# Patient Record
Sex: Male | Born: 1960 | Race: White | Hispanic: No | Smoking: Current every day smoker
Health system: Southern US, Community
[De-identification: ages and names within clinical notes are randomized; demographics above are authoritative.]

## PROBLEM LIST (undated history)

## (undated) DIAGNOSIS — I10 Essential (primary) hypertension: Secondary | ICD-10-CM

## (undated) DIAGNOSIS — J449 Chronic obstructive pulmonary disease, unspecified: Secondary | ICD-10-CM

## (undated) DIAGNOSIS — Z9861 Coronary angioplasty status: Principal | ICD-10-CM

## (undated) DIAGNOSIS — I219 Acute myocardial infarction, unspecified: Secondary | ICD-10-CM

## (undated) DIAGNOSIS — R519 Headache, unspecified: Secondary | ICD-10-CM

## (undated) DIAGNOSIS — D649 Anemia, unspecified: Secondary | ICD-10-CM

## (undated) DIAGNOSIS — K759 Inflammatory liver disease, unspecified: Secondary | ICD-10-CM

## (undated) DIAGNOSIS — I2510 Atherosclerotic heart disease of native coronary artery without angina pectoris: Principal | ICD-10-CM

## (undated) DIAGNOSIS — E785 Hyperlipidemia, unspecified: Secondary | ICD-10-CM

## (undated) HISTORY — DX: Coronary angioplasty status: Z98.61

## (undated) HISTORY — PX: TONSILLECTOMY: SUR1361

## (undated) HISTORY — DX: Atherosclerotic heart disease of native coronary artery without angina pectoris: I25.10

## (undated) HISTORY — DX: Chronic obstructive pulmonary disease, unspecified: J44.9

## (undated) HISTORY — DX: Essential (primary) hypertension: I10

## (undated) HISTORY — DX: Hyperlipidemia, unspecified: E78.5

---

## 2007-07-08 DIAGNOSIS — I2510 Atherosclerotic heart disease of native coronary artery without angina pectoris: Secondary | ICD-10-CM

## 2007-07-08 HISTORY — DX: Atherosclerotic heart disease of native coronary artery without angina pectoris: I25.10

## 2007-07-30 HISTORY — PX: CARDIAC CATHETERIZATION: SHX172

## 2007-07-30 HISTORY — PX: CORONARY STENT PLACEMENT: SHX1402

## 2008-04-06 HISTORY — PX: CARDIAC CATHETERIZATION: SHX172

## 2012-12-28 ENCOUNTER — Encounter: Payer: Self-pay | Admitting: *Deleted

## 2012-12-29 ENCOUNTER — Ambulatory Visit (INDEPENDENT_AMBULATORY_CARE_PROVIDER_SITE_OTHER): Payer: No Typology Code available for payment source | Admitting: Cardiology

## 2012-12-29 ENCOUNTER — Encounter: Payer: Self-pay | Admitting: Cardiology

## 2012-12-29 VITALS — BP 166/92 | HR 54 | Ht 72.0 in | Wt 172.0 lb

## 2012-12-29 DIAGNOSIS — Z720 Tobacco use: Secondary | ICD-10-CM

## 2012-12-29 DIAGNOSIS — R079 Chest pain, unspecified: Secondary | ICD-10-CM

## 2012-12-29 DIAGNOSIS — F17200 Nicotine dependence, unspecified, uncomplicated: Secondary | ICD-10-CM

## 2012-12-29 DIAGNOSIS — Z9861 Coronary angioplasty status: Secondary | ICD-10-CM | POA: Insufficient documentation

## 2012-12-29 DIAGNOSIS — I2510 Atherosclerotic heart disease of native coronary artery without angina pectoris: Secondary | ICD-10-CM

## 2012-12-29 DIAGNOSIS — E785 Hyperlipidemia, unspecified: Secondary | ICD-10-CM

## 2012-12-29 DIAGNOSIS — I10 Essential (primary) hypertension: Secondary | ICD-10-CM

## 2012-12-29 LAB — HEPATIC FUNCTION PANEL
Bilirubin, Direct: 0.3 mg/dL (ref 0.0–0.3)
Total Protein: 7 g/dL (ref 6.0–8.3)

## 2012-12-29 NOTE — Assessment & Plan Note (Signed)
Patient apparently with increased liver functions in the past. However he does consume significant amounts of EtOH. I will we check his LFTs today. I have asked him to decrease his alcohol use. If his liver functions are not elevated we will try Lipitor.

## 2012-12-29 NOTE — Assessment & Plan Note (Signed)
Patient counseled on discontinuing. 

## 2012-12-29 NOTE — Assessment & Plan Note (Addendum)
Continue aspirin and beta blocker. Patient is concerned about his symptoms of dizziness this past weekend. He did not have chest pain. His electrocardiogram is normal. Plan stress Myoview for risk stratification. We will obtain all of his records from [place] concerning his prior cardiac history.

## 2012-12-29 NOTE — Assessment & Plan Note (Signed)
Blood pressure is elevated. Continue beta blocker. I have asked him to track his pressure at home and we will add additional medications as needed.

## 2012-12-29 NOTE — Patient Instructions (Addendum)
Your physician has requested that you have en exercise stress myoview. For further information please visit [email or web address] Please follow instruction sheet, as given.  Your physician recommends that you return for lab work in: today  Your physician recommends that you schedule a follow-up appointment in: 3 months

## 2012-12-29 NOTE — Progress Notes (Signed)
  HPI: 52 year old male with past medical history of coronary artery disease for evaluation of dizziness and coronary disease. The patient apparently had a myocardial infarction in [place] approximately 5 years ago. He had a stent placed. I have none of those records available. 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 He describes general malaise and also dizziness particularly with smoking cigarettes. There was no dyspnea or chest pain. He otherwise denies dyspnea on exertion, orthopnea, PND, pedal edema, syncope or exertional chest pain. He was concerned about his symptoms and states they were reminiscent of those prior to his previous myocardial infarction. He did have chest pain with his infarct.  Current Outpatient Prescriptions  Medication Sig Dispense Refill  . aspirin 325 MG EC tablet Take 325 mg by mouth daily.      . metoprolol succinate (TOPROL-XL) 50 MG 24 hr tablet Take 50 mg by mouth daily. Take with or immediately following a meal.      . traZODone (DESYREL) 150 MG tablet Take 150 mg by mouth at bedtime.       No current facility-administered medications for this visit.    No Known Allergies  Past Medical History  Diagnosis Date  . Hypertension   . Hyperlipidemia   . CAD (coronary artery disease)     Prior MI; Stent [place], [place]  . COPD (chronic obstructive pulmonary disease)     Past Surgical History  Procedure Laterality Date  . Tonsillectomy      History   Social History  . Marital Status: Significant Other    Spouse Name: N/A    Number of Children: 2  . Years of Education: N/A   Occupational History  .     Social History Main Topics  . Smoking status: Current Every Day Smoker -- 0.50 packs/day for 15 years    Types: Cigarettes  . Smokeless tobacco: Never Used  . Alcohol Use: Yes     Comment: 4-5 beers per night  . Drug Use: No  . Sexually Active: Not on file   Other Topics Concern  . Not on file   Social History Narrative    . No narrative on file    Family History  Problem Relation Age of Onset  . Heart disease      No family history    ROS: no fevers or chills, productive cough, hemoptysis, dysphasia, odynophagia, melena, hematochezia, dysuria, hematuria, rash, seizure activity, orthopnea, PND, pedal edema, claudication. Remaining systems are negative.  Physical Exam:   Blood pressure 166/92, pulse 54, height 6' (1.829 m), weight 172 lb (78.019 kg).  General:  Well developed/well nourished, anxious in NAD Skin warm/dry Patient not depressed No peripheral clubbing Back-normal HEENT-normal/normal eyelids Neck supple/normal carotid upstroke bilaterally; no bruits; no JVD; no thyromegaly chest - CTA/ normal expansion CV - RRR/normal S1 and S2; no murmurs, rubs or gallops;  PMI nondisplaced Abdomen -NT/ND, no HSM, no mass, + bowel sounds, no bruit 2+ femoral pulses, no bruits Ext-no edema, chords, 2+ DP Neuro-grossly nonfocal  ECG Sinus bradycardia, no ST changes

## 2012-12-30 ENCOUNTER — Telehealth: Payer: Self-pay | Admitting: Cardiology

## 2012-12-30 NOTE — Telephone Encounter (Signed)
ROI faxed to Dr.Wilson Jeannetta Nap & Dr.Wolf/ [phone] Records received From Dr.Elkins Office 12/30/12, gave to [facility] 12/30/12/KM

## 2013-01-04 ENCOUNTER — Telehealth: Payer: Self-pay | Admitting: Cardiology

## 2013-01-04 ENCOUNTER — Encounter (HOSPITAL_COMMUNITY): Payer: No Typology Code available for payment source

## 2013-01-04 DIAGNOSIS — I2510 Atherosclerotic heart disease of native coronary artery without angina pectoris: Secondary | ICD-10-CM

## 2013-01-04 MED ORDER — ATORVASTATIN CALCIUM 10 MG PO TABS: 10.0000 mg | ORAL_TABLET | Freq: Every day | ORAL | Status: DC

## 2013-01-04 NOTE — Telephone Encounter (Signed)
New problem:  Test results.  

## 2013-01-04 NOTE — Telephone Encounter (Signed)
Spoke to patient lab results given.Advised to stop ETOH.Start Lipitor 10 mg daily.Repeat fasting lipids and hepatic panels in 6 weeks.

## 2013-01-04 NOTE — Telephone Encounter (Signed)
Pt called to let Dr. Jens Som know that when he was seen in this office on 12/30/22 pt forgot to mention to the MD that beside having dizziness;  he is also having SOB with Moderate activity on and off for  3 to 4 months. Pt was  scheduled for a stress Myoview, which he postpone because he wanted to see the labs results first. Pt was made aware that smoking will contribute to SOB; and that he needs  the stress test specially because all the  symptoms he is having. Pt verbalized understanding.

## 2013-01-04 NOTE — Telephone Encounter (Signed)
New Prob    Calling to follow up on blood work. Would like to speak nurse.

## 2013-01-05 NOTE — Telephone Encounter (Signed)
Spoke with pt, he is concerned because he has a deductible and wants to know if this will be filled with the insurance as preventive. Will get the CPT code for the testing and then the pt is going to call his insurance to see what will be covered. I explained to the pt because of his symptoms doubt it would be considered preventive.

## 2013-01-25 ENCOUNTER — Telehealth: Payer: Self-pay | Admitting: Cardiology

## 2013-01-25 NOTE — Telephone Encounter (Signed)
Records rec From [facility] Office in [place] To [place]  01/25/13/KM

## 2013-02-04 ENCOUNTER — Other Ambulatory Visit: Payer: No Typology Code available for payment source

## 2013-02-12 ENCOUNTER — Other Ambulatory Visit: Payer: No Typology Code available for payment source

## 2013-02-26 ENCOUNTER — Other Ambulatory Visit: Payer: No Typology Code available for payment source

## 2013-03-12 ENCOUNTER — Other Ambulatory Visit: Payer: No Typology Code available for payment source

## 2013-03-19 ENCOUNTER — Other Ambulatory Visit (INDEPENDENT_AMBULATORY_CARE_PROVIDER_SITE_OTHER): Payer: No Typology Code available for payment source

## 2013-03-19 DIAGNOSIS — I2510 Atherosclerotic heart disease of native coronary artery without angina pectoris: Secondary | ICD-10-CM

## 2013-03-19 LAB — LIPID PANEL
HDL: 43.7 mg/dL (ref >39.00)
Total CHOL/HDL Ratio: 2
Triglycerides: 54 mg/dL (ref 0.0–149.0)

## 2013-03-19 LAB — HEPATIC FUNCTION PANEL
ALT: 75 U/L — ABNORMAL HIGH (ref 0–53)
AST: 114 U/L — ABNORMAL HIGH (ref 0–37)
Albumin: 3.7 g/dL (ref 3.5–5.2)

## 2013-03-24 ENCOUNTER — Encounter: Payer: Self-pay | Admitting: *Deleted

## 2013-03-24 ENCOUNTER — Encounter: Payer: Self-pay | Admitting: Cardiology

## 2013-03-29 ENCOUNTER — Encounter: Payer: No Typology Code available for payment source | Admitting: Cardiology

## 2013-03-29 NOTE — Progress Notes (Signed)
   HPI: Pleasant male for fu of coronary disease. The patient apparently had a myocardial infarction in [place] approximately 5 years ago. He had a stent placed. I have none of those records available. When I saw him previously in March of 2014 he was complaining of dizziness. A nuclear study was ordered but he did not come for this. Note his LFTs have been elevated previously. Since I last saw him,    Current Outpatient Prescriptions  Medication Sig Dispense Refill  . aspirin 325 MG EC tablet Take 325 mg by mouth daily.      Marland Kitchen atorvastatin (LIPITOR) 10 MG tablet Take 1 tablet (10 mg total) by mouth daily.  30 tablet  6  . metoprolol succinate (TOPROL-XL) 50 MG 24 hr tablet Take 50 mg by mouth daily. Take with or immediately following a meal.      . traZODone (DESYREL) 150 MG tablet Take 150 mg by mouth at bedtime.       No current facility-administered medications for this visit.     Past Medical History  Diagnosis Date  . Hypertension   . Hyperlipidemia   . CAD (coronary artery disease)     Prior MI; Stent [place], [place]  . COPD (chronic obstructive pulmonary disease)     Past Surgical History  Procedure Laterality Date  . Tonsillectomy      History   Social History  . Marital Status: Significant Other    Spouse Name: N/A    Number of Children: 2  . Years of Education: N/A   Occupational History  .     Social History Main Topics  . Smoking status: Current Every Day Smoker -- 0.50 packs/day for 15 years    Types: Cigarettes  . Smokeless tobacco: Never Used  . Alcohol Use: Yes     Comment: 4-5 beers per night  . Drug Use: No  . Sexually Active: Not on file   Other Topics Concern  . Not on file   Social History Narrative  . No narrative on file    ROS: no fevers or chills, productive cough, hemoptysis, dysphasia, odynophagia, melena, hematochezia, dysuria, hematuria, rash, seizure activity, orthopnea, PND, pedal edema, claudication. Remaining systems  are negative.  Physical Exam: Well-developed well-nourished in no acute distress.  Skin is warm and dry.  HEENT is normal.  Neck is supple.  Chest is clear to auscultation with normal expansion.  Cardiovascular exam is regular rate and rhythm.  Abdominal exam nontender or distended. No masses palpated. Extremities show no edema. neuro grossly intact  ECG     This encounter was created in error - please disregard.

## 2013-04-01 ENCOUNTER — Ambulatory Visit: Payer: No Typology Code available for payment source | Admitting: Cardiology

## 2013-04-16 ENCOUNTER — Other Ambulatory Visit: Payer: Self-pay | Admitting: *Deleted

## 2013-04-16 DIAGNOSIS — N289 Disorder of kidney and ureter, unspecified: Secondary | ICD-10-CM

## 2013-04-20 ENCOUNTER — Encounter: Payer: Self-pay | Admitting: Cardiology

## 2013-05-04 ENCOUNTER — Telehealth: Payer: Self-pay | Admitting: Cardiology

## 2013-05-04 NOTE — Telephone Encounter (Signed)
Left message for pt to call.

## 2013-05-04 NOTE — Telephone Encounter (Signed)
Spoke with pt brother, he reports the pt just had labs done with dr Jeannetta Nap. Will call and see if we can get those results.

## 2013-05-04 NOTE — Telephone Encounter (Signed)
Follow Up     Following up on blood work.

## 2013-05-04 NOTE — Telephone Encounter (Signed)
Follow up  ° ° ° °Returning call back to nurse  °

## 2013-05-05 NOTE — Telephone Encounter (Signed)
Follow Up     Pts brother called returning your call. Please call back.

## 2013-05-05 NOTE — Telephone Encounter (Signed)
Spoke with pt brother, he confirms the pt is not taking lipitor. The pt has an appt with a liver specialist at [facility].

## 2013-05-28 ENCOUNTER — Other Ambulatory Visit: Payer: No Typology Code available for payment source

## 2013-07-27 DIAGNOSIS — F419 Anxiety disorder, unspecified: Secondary | ICD-10-CM | POA: Insufficient documentation

## 2013-07-27 DIAGNOSIS — F1010 Alcohol abuse, uncomplicated: Secondary | ICD-10-CM | POA: Insufficient documentation

## 2013-07-27 DIAGNOSIS — R768 Other specified abnormal immunological findings in serum: Secondary | ICD-10-CM | POA: Insufficient documentation

## 2013-07-27 DIAGNOSIS — K759 Inflammatory liver disease, unspecified: Secondary | ICD-10-CM

## 2013-07-27 HISTORY — DX: Inflammatory liver disease, unspecified: K75.9

## 2014-11-28 ENCOUNTER — Other Ambulatory Visit: Payer: Self-pay | Admitting: Orthopedic Surgery

## 2014-11-28 DIAGNOSIS — M545 Low back pain: Secondary | ICD-10-CM

## 2014-11-29 ENCOUNTER — Other Ambulatory Visit: Payer: Self-pay | Admitting: Orthopedic Surgery

## 2014-11-29 DIAGNOSIS — M545 Low back pain: Secondary | ICD-10-CM

## 2014-12-09 ENCOUNTER — Other Ambulatory Visit: Payer: No Typology Code available for payment source

## 2016-09-18 ENCOUNTER — Ambulatory Visit (INDEPENDENT_AMBULATORY_CARE_PROVIDER_SITE_OTHER): Payer: 59 | Admitting: Podiatry

## 2016-09-18 ENCOUNTER — Encounter: Payer: Self-pay | Admitting: Podiatry

## 2016-09-18 VITALS — BP 157/84 | HR 84 | Ht 72.0 in | Wt 175.0 lb

## 2016-09-18 DIAGNOSIS — L84 Corns and callosities: Secondary | ICD-10-CM | POA: Diagnosis not present

## 2016-09-18 DIAGNOSIS — M79671 Pain in right foot: Secondary | ICD-10-CM | POA: Diagnosis not present

## 2016-09-18 DIAGNOSIS — L851 Acquired keratosis [keratoderma] palmaris et plantaris: Secondary | ICD-10-CM | POA: Diagnosis not present

## 2016-09-18 DIAGNOSIS — M79672 Pain in left foot: Secondary | ICD-10-CM | POA: Diagnosis not present

## 2016-09-18 MED ORDER — HYDROCODONE-ACETAMINOPHEN 5-325 MG PO TABS: 1.0000 | ORAL_TABLET | Freq: Four times a day (QID) | ORAL | 0 refills | Status: DC | PRN

## 2016-09-18 NOTE — Progress Notes (Signed)
Subjective: Patient presents to the office today for chief complaint of painful callus lesions of the feet. Patient states that the pain is ongoing and is affecting their ability to ambulate without pain. Patient presents today for further treatment and evaluation.  Objective:  Physical Exam General: Alert and oriented x3 in no acute distress  Dermatology: Hyperkeratotic lesion present on the weightbearing surfaces of the bilateral great toes and fifth MPJs. Pain on palpation with a central nucleated core noted.  Skin is warm, dry and supple bilateral lower extremities. Negative for open lesions or macerations.  Vascular: Palpable pedal pulses bilaterally. No edema or erythema noted. Capillary refill within normal limits.  Neurological: Epicritic and protective threshold grossly intact bilaterally.   Musculoskeletal Exam: Pain on palpation at the keratotic lesion noted. Range of motion within normal limits bilateral. Muscle strength 5/5 in all groups bilateral.  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  #3 Treated area(s) with Salinocaine and dressed with light dressing. #4 prescription [facility] keratinolytic [facility] #5 prescription for hydrocodone 5/325mg  #6 Patient is to return to the clinic PRN.   Felecia ShellingBrent M. Evans, DPM [facility]

## 2016-09-20 MED ORDER — NONFORMULARY OR COMPOUNDED ITEM: 1.0000 g | Freq: Every day | 2 refills | Status: DC

## 2016-09-20 NOTE — Addendum Note (Signed)
Addended by: Renaldo ReelPARRY, MELODY A on: 09/20/2016 05:00 PM   Modules accepted: Orders

## 2017-07-14 ENCOUNTER — Telehealth: Payer: Self-pay | Admitting: Cardiology

## 2017-07-14 NOTE — Telephone Encounter (Signed)
Received records from [facility] for appointment on 08/05/17 with Dr Jens Som.  Records put with Dr Ludwig Clarks schedule for 08/05/17. lp

## 2017-08-05 ENCOUNTER — Ambulatory Visit: Payer: No Typology Code available for payment source | Admitting: Cardiology

## 2017-11-05 ENCOUNTER — Ambulatory Visit (INDEPENDENT_AMBULATORY_CARE_PROVIDER_SITE_OTHER): Payer: BLUE CROSS/BLUE SHIELD | Admitting: Podiatry

## 2017-11-05 DIAGNOSIS — M7752 Other enthesopathy of left foot: Secondary | ICD-10-CM | POA: Diagnosis not present

## 2017-11-05 DIAGNOSIS — L989 Disorder of the skin and subcutaneous tissue, unspecified: Secondary | ICD-10-CM | POA: Diagnosis not present

## 2017-11-08 NOTE — Progress Notes (Signed)
   Subjective: Patient presents to the office today for a chief complaint of painful callus lesions of the bilateral feet that has been ongoing for several years. He also reports pain to the lateral left foot. He states he stands on concrete all day which exacerbates the pain. He has tried soaking his feet and wearing different shoes with no significant relief. Patient presents today for further treatment and evaluation.   Past Medical History:  Diagnosis Date  . CAD (coronary artery disease)    Prior MI; [place]  . COPD (chronic obstructive pulmonary disease) (HCC)   . Hyperlipidemia   . Hypertension      Objective:  Physical Exam General: Alert and oriented x3 in no acute distress  Dermatology: Hyperkeratotic lesions present on the left sub-fifth MPJ and bilateral great toes. Pain on palpation with a central nucleated core noted. Skin is warm, dry and supple bilateral lower extremities. Negative for open lesions or macerations.  Vascular: Palpable pedal pulses bilaterally. No edema or erythema noted. Capillary refill within normal limits.  Neurological: Epicritic and protective threshold grossly intact bilaterally.   Musculoskeletal Exam: Pain on palpation at the keratotic lesions noted as well as the 5th MPJ of the left foot. Range of motion within normal limits bilateral. Muscle strength 5/5 in all groups bilateral.  Assessment: #1 Porokeratosis left sub-fifth MPJ #2 bilateral great toes calluses  #3 5th MPJ capsulitis left   Plan of Care:  #1 Patient evaluated. #2 Excisional debridement of keratoic lesions using a chisel blade was performed without incident.  #3 Dressed area with light dressing. #4 Injection of 0.5 mLs Celestone Soluspan injected into the 5th MPJ of the left foot. #5 Recommended OTC salicylic acid cream. #6 Patient is to return to the clinic PRN.   Felecia ShellingBrent M. Evans, DPM [facility]  Dr. Felecia ShellingBrent M. Evans, DPM    [address]                                        [place], [place] [id number]                Office [phone]  Fax [phone]

## 2017-11-24 ENCOUNTER — Encounter: Payer: Self-pay | Admitting: Cardiology

## 2017-11-24 ENCOUNTER — Ambulatory Visit: Payer: BLUE CROSS/BLUE SHIELD | Admitting: Cardiology

## 2017-11-24 VITALS — BP 163/88 | HR 80 | Ht 71.0 in | Wt 169.0 lb

## 2017-11-24 DIAGNOSIS — I2510 Atherosclerotic heart disease of native coronary artery without angina pectoris: Secondary | ICD-10-CM | POA: Diagnosis not present

## 2017-11-24 DIAGNOSIS — E782 Mixed hyperlipidemia: Secondary | ICD-10-CM | POA: Diagnosis not present

## 2017-11-24 DIAGNOSIS — Z01810 Encounter for preprocedural cardiovascular examination: Secondary | ICD-10-CM

## 2017-11-24 DIAGNOSIS — Z720 Tobacco use: Secondary | ICD-10-CM

## 2017-11-24 DIAGNOSIS — Z9861 Coronary angioplasty status: Secondary | ICD-10-CM

## 2017-11-24 DIAGNOSIS — I10 Essential (primary) hypertension: Secondary | ICD-10-CM | POA: Diagnosis not present

## 2017-11-24 NOTE — Patient Instructions (Signed)
No medication changes     TEST SCHEDULE AT [address] Your physician has requested that you have an echocardiogram. Echocardiography is a painless test that uses sound waves to create images of your heart. It provides your doctor with information about the size and shape of your heart and how well your heart's chambers and valves are working. This procedure takes approximately one hour. There are no restrictions for this procedure.  IF TEST IS NORMAL MAY HAVE CLEARANCE FOR SURGERY WITH DR EUGENE BELL.    Your physician recommends that you schedule a follow-up appointment in 1 MONTH WITH DR HARDING.

## 2017-11-24 NOTE — Assessment & Plan Note (Signed)
Earl Campbell has a pending urologic surgery which is would be a low risk from a cardiac standpoint.  He has no active anginal or heart failure symptoms.  No history of stroke, diabetes or renal insufficiency.  As such, by the revised cardiovascular risk index, he would be a low risk patient for a low risk surgery. By [facility] AHA guidelines, in the absence of any active anginal or heart failure symptoms, there is no indication for nuclear stress test or other ischemic evaluation. I do think since he has been lost to follow-up for several years, an echocardiogram was a reasonable choice to start with.  If there is no regional wall motion normalities or reduced EF, I do not see any reason for ischemic evaluation.  Recommendation will be to proceed with surgery if the echocardiogram is relatively normal (i.e. no regional wall motion abnormality).

## 2017-11-24 NOTE — Assessment & Plan Note (Signed)
He did not seem interested in cessation counseling.

## 2017-11-24 NOTE — Assessment & Plan Note (Signed)
Thankfully no further symptoms.  No chest pain or dyspnea with rest or exertion. He had been on a beta-blocker, but not currently now.  He thinks it may been stopped by 1 of his doctors along the way. For now would simply continue his current medications as making any adjustments preoperatively would probably not help anyway. Also no longer on a statin, likely related to liver disease.   He never had a heart attack by the catheter reports, and on both those reports his ejection fraction was somewhere between 55-70%.  There were no regional wall motion normalities. I think for baseline evaluation, a 2D echocardiogram will help give us a sense of the EF as well as filling pressures.  There will also be opportunity to identify any potential regional wall motion more extensive ischemic evaluation. Otherwise with no active anginal symptoms, I see no reason to perform a Myoview stress test, even if it is for preop.

## 2017-11-24 NOTE — Assessment & Plan Note (Addendum)
There is definitely concern for LFTs with his questionable hep C history as well as chronic alcohol use.  Probably not the best option to use a statin.  I am not sure if his PCP has been following these levels.  No labs available.

## 2017-11-24 NOTE — Assessment & Plan Note (Signed)
Blood pressure is high today.  I have no idea what his baseline has been with his PCP, but he can probably tolerate taking his full dose of antihypertensive agent.  Would also definitely benefit from beta-blocker, but he indicates that this was stopped by 1 of his physicians, so I will not that battle at this time.

## 2017-11-24 NOTE — Progress Notes (Signed)
PCP: Earl MaskElkins, Earl Oliver, MD Urology - Latrelle DodrillEugene D.Alvester MorinBell, MD  Clinic Note: Chief Complaint  Patient presents with  . New Patient (Initial Visit)    Preop  . Coronary Artery Disease    History of PCI in 2008 has not seen a cardiologist since 2009-     HPI: Earl Campbell is a 57 y.o. male who is being seen today for preoperative risk evaluation of history of CAD-PCI at the request of Earl Campbell, Earl Campbell, *as well as Dr. Modena SlaterEugene Bell. Earl Campbell is in the process of being evaluated for what sounds like a prostate surgery, however from the PCPs noted suggests hernia surgery.  Earl Campbell is a very pleasant gentleman long-term smoker of at least 1/2 to 1 PPD for over 30 years who also drinks anywhere from a 6 pack to 12 pack of beer a night.  He has a history of single-vessel CAD from October 2008 while he was living in [place].  He apparently was admitted with chest pain and had a Cardiolite showing likely anterior ischemia (the actual Cardiolite report is not present).  He underwent cardiac catheterization showing 80% mid LAD lesion treated with DES stent (see past surgical history). He was seen by Dr. Jens Somrenshaw back in 2014, at that time he was on a statin and beta-blocker which she is no longer on either.  He only takes one half of his blood pressure pills a day.  Earl Campbell Lynn Campbell was last seen by his PCP in September.  Basically this has that there is a question of if [facility] is [facility] up, he may need to have cardiology evaluation prior to surgery.  Recent Hospitalizations: None  Studies Personally Reviewed - (if available, images/films reviewed: From Epic Chart or Care Everywhere)  Cardiac catheterization and PCI from October 2008 as well as July 2009 -reports reviewed and PSH updated.  Interval History:  Earl Campbell presents here today pretty much not sure why he is here.  When I asked him initially about his history, there was no comment about prior PCI.  This was gleaned by chart review of  procedures and a quick note from his PCP.  He does not remember if he had a heart attack at the time of her was simply related to chest pain.  However reviewing his catheter reports it clearly was not an MI.  He presented with anginal symptoms, had an abnormal Myoview/Cardiolite that led to a cardiac catheterization.  He actually does not even recall the symptoms he was feeling at that time.  Since that time he did go back into the hospital once in 2009 for chest discomfort that was thought to be not anginal as his cardiac cath showed relatively normal coronary arteries with widely patent stent and only 30% RCA.  Since then, he has seen cardiologist once since moving to [place].  He saw Dr. Jens Somrenshaw in March 2014 had a stress test ordered at that time which he did not follow through.  Apparently he was no longer taking Lipitor due to concerns for liver disease.  (Apparently had hepatitis C, and likely also has some complement  He has mild baseline dyspnea, but otherwise negative cardiac review of symptoms:  No chest pain or  shortness of breath with rest or exertion.   No PND, orthopnea or edema.   No palpitations, lightheadedness, dizziness, weakness or syncope/near syncope.  No TIA/amaurosis fugax symptoms.  No melena, hematochezia, hematuria, or epstaxis.  No claudication.  ROS: A comprehensive was performed. Review of  Systems  Constitutional: Negative for malaise/fatigue.  HENT: Negative for nosebleeds.   Respiratory: Positive for cough (Morning smoker's cough.) and shortness of breath (No change from baseline). Negative for sputum production and wheezing.   Gastrointestinal: Negative for abdominal pain, blood in stool, constipation, heartburn and melena.  Musculoskeletal: Negative for falls and joint pain.  Neurological: Negative for dizziness and weakness.  Psychiatric/Behavioral: Negative for memory loss (Although he has very poor recollection of just about anything about his  medical health.).  All other systems reviewed and are negative.     I have reviewed and (if needed) personally updated the patient's problem list, medications, allergies, past medical and surgical history, social and family history.   Past Medical History:  Diagnosis Date  . CAD S/P percutaneous coronary angioplasty 07/2007    abnormal nuclear ST --> Cath --> 80%mLAD - PCI with Promus DES 3.0 mm x 23 mm (~3.2 mm).[place], [place].  - Patent stent with ~30% RCA on relook cath 04/2008  . COPD (chronic obstructive pulmonary disease) (HCC)   . Hyperlipidemia   . Hypertension     Past Surgical History:  Procedure Laterality Date  . CARDIAC CATHETERIZATION  07/30/2007   (Dr. Marquette Saa --[facility] [place], [place]): abnormal Nuclear ST ordered to evaluate chest pain- 80% mLAD just after the major diagonal--> PCI; small ramus intermedius noted.  That the circumflex are relatively normal.  Dominant RCA relatively normal.  EF 70%.  . CARDIAC CATHETERIZATION  04/2008   [facility], [place]; Patent mLAD stent, 30%.  EF at least 55%.  . CORONARY STENT PLACEMENT  07/30/2007   PCI - mLAD - Promus DES 3.0 mm x 23 mm (3.2 mm).  . TONSILLECTOMY      Current Meds  Medication Sig  . aspirin 325 MG EC tablet Take 325 mg by mouth daily.  Marland Kitchen lisinopril-hydrochlorothiazide (PRINZIDE,ZESTORETIC) 20-25 MG tablet TAKE 1/2 TABLET BY MOUTH EVERY 24 HOIURS FOR HYPERTENSION  . tamsulosin (FLOMAX) 0.4 MG CAPS capsule   . [DISCONTINUED] amLODipine (NORVASC) 10 MG tablet Take 10 mg by mouth daily.  . [DISCONTINUED] HYDROcodone-acetaminophen (NORCO) 5-325 MG tablet Take 1 tablet by mouth every 6 (six) hours as needed for moderate pain.  . [DISCONTINUED] metoprolol succinate (TOPROL-XL) 50 MG 24 hr tablet Take 50 mg by mouth daily. Take with or immediately following a meal.  . [DISCONTINUED] NONFORMULARY OR COMPOUNDED ITEM Apply 1-2 g topically daily.  .  [DISCONTINUED] QUEtiapine (SEROQUEL) 25 MG tablet TAKE 1 TABLET BY MOUTH AT BEDTIME X1 WEEK, 2 TABS X1 WEEK, 3 TABS X 1 WEEK THEN 4 TABS X 1 WEEK  . [DISCONTINUED] Tamsulosin HCl (FLOMAX PO) Take by mouth.  . [DISCONTINUED] traZODone (DESYREL) 150 MG tablet Take 150 mg by mouth at bedtime.    No Known Allergies  Social History   Tobacco Use  . Smoking status: Current Every Day Smoker    Packs/day: 0.50    Years: 30.00    Pack years: 15.00    Types: Cigarettes  . Smokeless tobacco: Never Used  . Tobacco comment: Has cut down from 1 pack/day  Substance Use Topics  . Alcohol use: Yes    Comment: 6-12 beers per night  . Drug use: No   Social History   Social History Narrative   Dardan now lives here in [place] with his brother Marston.  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  Currently working for [employer].   He  indicates that he drinks anywhere from 6-12 beers a night.   He is separated from his wife who now lives in [place].    family history includes Heart disease in his brother and unknown relative; Hypertension in his father and mother.  Wt Readings from Last 3 Encounters:  11/24/17 169 lb (76.7 kg)  09/18/16 175 lb (79.4 kg)  12/29/12 172 lb (78 kg)    PHYSICAL EXAM BP (!) 163/88   Pulse 80   Ht 5\' 11"  (1.803 m)   Wt 169 lb (76.7 kg)   BMI 23.57 kg/m  Physical Exam  Constitutional: He is oriented to person, place, and time. He appears well-developed and well-nourished. No distress.  Well-groomed.  Smells of tobacco smoke.  HENT:  Head: Normocephalic and atraumatic.  Mouth/Throat: Oropharynx is clear and moist.  Very poor dentition  Eyes: Conjunctivae and EOM are normal. Pupils are equal, round, and reactive to light. No scleral icterus.  Neck: Normal range of motion. Neck supple. No hepatojugular reflux and no JVD present. Carotid bruit is not present.  Cardiovascular: Normal rate, regular rhythm, normal heart sounds and  normal pulses.  No extrasystoles are present. PMI is not displaced. Exam reveals no gallop and no friction rub.  No murmur heard. Pulmonary/Chest: Effort normal. No respiratory distress. He has no wheezes. He has no rales.  Mild diffuse interstitial sounds with intermittent rhonchi.  Abdominal: Soft. Bowel sounds are normal. He exhibits no distension. There is no tenderness.  Musculoskeletal: Normal range of motion. He exhibits no edema.  Neurological: He is alert and oriented to person, place, and time. No cranial nerve deficit.  Skin: Skin is warm and dry.  Psychiatric: He has a normal mood and affect. His behavior is normal. Thought content normal.  Somewhat slow, blunted affect.  Does not really fully comprehend his cardiac history.  Nursing note and vitals reviewed.    Adult ECG Report  Rate: 80;  Rhythm: normal sinus rhythm and Normal axis, intervals and durations;   Narrative Interpretation: Normal EKG   Other studies Reviewed: Additional studies/ records that were reviewed today include:  Recent Labs:   No results found for: CREATININE, BUN, NA, K, CL, CO2 No labs available   ASSESSMENT / PLAN: Problem List Items Addressed This Visit    CAD S/P percutaneous coronary angioplasty - Primary (Chronic)    Thankfully no further symptoms.  No chest pain or dyspnea with rest or exertion. He had been on a beta-blocker, but not currently now.  He thinks it may been stopped by 1 of his doctors along the way. For now would simply continue his current medications as making any adjustments preoperatively would probably not help anyway. Also no longer on a statin, likely related to liver disease.   He never had a heart attack by the catheter reports, and on both those reports his ejection fraction was somewhere between 55-70%.  There were no regional wall motion normalities. I think for baseline evaluation, a 2D echocardiogram will help give [place] a sense of the EF as well as filling  pressures.  There will also be opportunity to identify any potential regional wall motion more extensive ischemic evaluation. Otherwise with no active anginal symptoms, I see no reason to perform a Myoview stress test, even if it is for preop.      Relevant Medications   lisinopril-hydrochlorothiazide (PRINZIDE,ZESTORETIC) 20-25 MG tablet   Other Relevant Orders   EKG 12-Lead   ECHOCARDIOGRAM COMPLETE   Essential hypertension (Chronic)  Blood pressure is high today.  I have no idea what his baseline has been with his PCP, but he can probably tolerate taking his full dose of antihypertensive agent.  Would also definitely benefit from beta-blocker, but he indicates that this was stopped by 1 of his physicians, so I will not that battle at this time.      Relevant Medications   lisinopril-hydrochlorothiazide (PRINZIDE,ZESTORETIC) 20-25 MG tablet   Hyperlipidemia (Chronic)    There is definitely concern for LFTs with his questionable hep C history as well as chronic alcohol use.  Probably not the best option to use a statin.  I am not sure if his PCP has been following these levels.  No labs available.      Relevant Medications   lisinopril-hydrochlorothiazide (PRINZIDE,ZESTORETIC) 20-25 MG tablet   Preop cardiovascular exam    Takai has a pending urologic surgery which is would be a low risk from a cardiac standpoint.  He has no active anginal or heart failure symptoms.  No history of stroke, diabetes or renal insufficiency.  As such, by the revised cardiovascular risk index, he would be a low risk patient for a low risk surgery. By [facility] AHA guidelines, in the absence of any active anginal or heart failure symptoms, there is no indication for nuclear stress test or other ischemic evaluation. I do think since he has been lost to follow-up for several years, an echocardiogram was a reasonable choice to start with.  If there is no regional wall motion normalities or reduced EF, I do not see  any reason for ischemic evaluation.  Recommendation will be to proceed with surgery if the echocardiogram is relatively normal (i.e. no regional wall motion abnormality).      Relevant Orders   EKG 12-Lead   ECHOCARDIOGRAM COMPLETE   Tobacco abuse (Chronic)    He did not seem interested in cessation counseling.         Current medicines are reviewed at length with the patient today. (+/- concerns) none The following changes have been made:None  Patient Instructions  No medication changes     TEST SCHEDULE AT [address] Your physician has requested that you have an echocardiogram. Echocardiography is a painless test that uses sound waves to create images of your heart. It provides your doctor with information about the size and shape of your heart and how well your heart's chambers and valves are working. This procedure takes approximately one hour. There are no restrictions for this procedure.  IF TEST IS NORMAL MAY HAVE CLEARANCE FOR SURGERY WITH DR Earl BELL.    Your physician recommends that you schedule a follow-up appointment in 1 MONTH WITH DR .     Studies Ordered:   Orders Placed This Encounter  Procedures  . EKG 12-Lead  . ECHOCARDIOGRAM COMPLETE      Bryan Lemma, M.D., M.S. Interventional Cardiologist   Pager # [phone] Phone # [phone] [address] [place], [place] [address]   Thank you for choosing [facility] at [facility]!!

## 2017-11-27 ENCOUNTER — Telehealth: Payer: Self-pay | Admitting: Cardiology

## 2017-11-27 NOTE — Telephone Encounter (Signed)
Returned the call to the patient. He called in wanting to know why he had to have an echo. It was explained that this was preoperative and for a baseline to evaluate his EF and overall heart structure.   He stated that this will cost him $2200 so would rather not do it at this time. He does not know how much his surgery will cost and he cannot afford to do this if it is not absolutely necessary.

## 2017-11-27 NOTE — Telephone Encounter (Signed)
New Message     Patient would like to speak with Dr Herbie Baltimoreharding on why he needs to have a Echo is it medically necessary

## 2017-11-27 NOTE — Telephone Encounter (Signed)
patient cancelled echo for 11/28/2017 pt doesnt want test it is not medically necessary per pt

## 2017-11-28 ENCOUNTER — Other Ambulatory Visit (HOSPITAL_COMMUNITY): Payer: No Typology Code available for payment source

## 2017-11-28 NOTE — Telephone Encounter (Signed)
The reason for doing the echo is not had any evaluation of his heart in over 10 years.  To have his surgery, we need to have some type of assessment of his heart function to know his overall cardiac risk.  He does not have to have it done if he does not want to have it done, but cannot then fully comment on his cardiovascular risk.   Is he saying that the full $2000 cost falls on him with no coverage from the insurance company?  If so, then it seems like an issue with coverage that we may need to figure out.  Bryan Lemmaavid Harding, MD

## 2017-11-28 NOTE — Telephone Encounter (Signed)
Patient stated that he would do the echo if we could possibly find somewhere cheaper to get it done.

## 2017-12-02 NOTE — Telephone Encounter (Signed)
Follow up   Patient following up on call from nurse.

## 2017-12-05 NOTE — Telephone Encounter (Signed)
Spoke with patient . Patient states he will have echo done next week at the [address] location. RN offer to switch location . Patient would prefer the TO CONTINUE WITH SCHEDULE AS PLANNED. DUE TO MISSING WORK.

## 2017-12-10 ENCOUNTER — Ambulatory Visit (HOSPITAL_COMMUNITY): Payer: BLUE CROSS/BLUE SHIELD | Attending: Cardiology

## 2017-12-10 ENCOUNTER — Other Ambulatory Visit: Payer: Self-pay

## 2017-12-10 DIAGNOSIS — I2510 Atherosclerotic heart disease of native coronary artery without angina pectoris: Secondary | ICD-10-CM | POA: Diagnosis not present

## 2017-12-10 DIAGNOSIS — Z9861 Coronary angioplasty status: Secondary | ICD-10-CM | POA: Diagnosis present

## 2017-12-10 DIAGNOSIS — I420 Dilated cardiomyopathy: Secondary | ICD-10-CM | POA: Insufficient documentation

## 2017-12-10 DIAGNOSIS — Z01810 Encounter for preprocedural cardiovascular examination: Secondary | ICD-10-CM | POA: Insufficient documentation

## 2017-12-17 ENCOUNTER — Telehealth: Payer: Self-pay | Admitting: Cardiology

## 2017-12-17 NOTE — Telephone Encounter (Signed)
New message   Pre op status Pam from Dr Shannan HarperBell's office [phone] ext [phone], calling to follow up on echo that was done as part of the pre op clearance.   Fax [phone] Attn: Elita QuickPam

## 2017-12-18 NOTE — Telephone Encounter (Signed)
   Primary Cardiologist: Dr. Herbie BaltimoreHarding  Chart reviewed as part of pre-operative protocol coverage. Given past medical history and time since last visit, based on ACC/AHA guidelines, Earl Campbell would be at acceptable risk for the planned procedure without further cardiovascular testing.   Pt was seen by Dr. Herbie BaltimoreHarding on 11/24/17 and he cleared him for surgery. Felt to be low risk . Echo was obtained and showed normal LVEF and mild aortic valve calcification. No further w/u needed. He can be cleared for surgery.   I will route this recommendation to the requesting party via Epic fax function and remove from pre-op pool.  Please call with questions.  Robbie LisBrittainy Simmons, PA-C 12/18/2017, 4:02 PM

## 2017-12-30 NOTE — Progress Notes (Signed)
Chart reviewed as part of pre-operative protocol coverage. Given past medical history and time since last visit, based on ACC/AHA guidelines, Earl Campbell would be at acceptable risk for the planned procedure without further cardiovascular testing.   Pt was seen by Dr. Herbie BaltimoreHarding on 11/24/17 and he cleared him for surgery. Felt to be low risk . Echo was obtained and showed normal LVEF and mild aortic valve calcification. No further w/u needed. He can be cleared for surgery.   I will route this recommendation to the requesting party via Epic fax function and remove from pre-op pool.  Please call with questions.  Robbie LisBrittainy Simmons, PA-C 12/18/2017, 4:02 PM

## 2018-01-05 ENCOUNTER — Ambulatory Visit: Payer: No Typology Code available for payment source | Admitting: Cardiology

## 2018-09-15 ENCOUNTER — Ambulatory Visit (INDEPENDENT_AMBULATORY_CARE_PROVIDER_SITE_OTHER): Payer: BLUE CROSS/BLUE SHIELD | Admitting: Podiatry

## 2018-09-15 DIAGNOSIS — L84 Corns and callosities: Secondary | ICD-10-CM

## 2018-09-15 DIAGNOSIS — M79675 Pain in left toe(s): Secondary | ICD-10-CM

## 2018-09-15 DIAGNOSIS — B351 Tinea unguium: Secondary | ICD-10-CM | POA: Diagnosis not present

## 2018-09-15 DIAGNOSIS — Q828 Other specified congenital malformations of skin: Secondary | ICD-10-CM | POA: Diagnosis not present

## 2018-09-15 DIAGNOSIS — M79674 Pain in right toe(s): Secondary | ICD-10-CM

## 2018-09-15 NOTE — Patient Instructions (Addendum)
Corns and Calluses Corns are small areas of thickened skin that occur on the top, sides, or tip of a toe. They contain a cone-shaped core with a point that can press on a nerve below. This causes pain. Calluses are areas of thickened skin that can occur anywhere on the body including hands, fingers, palms, soles of the feet, and heels.Calluses are usually larger than corns. What are the causes? Corns and calluses are caused by rubbing (friction) or pressure, such as from shoes that are too tight or do not fit properly. What increases the risk? Corns are more likely to develop in people who have toe deformities, such as hammer toes. Since calluses can occur with friction to any area of the skin, calluses are more likely to develop in people who:  Work with their hands.  Wear shoes that fit poorly, shoes that are too tight, or shoes that are high-heeled.  Have toes deformities.  What are the signs or symptoms? Symptoms of a corn or callus include:  A hard growth on the skin.  Pain or tenderness under the skin.  Redness and swelling.  Increased discomfort while wearing tight-fitting shoes.  How is this diagnosed? Corns and calluses may be diagnosed with a medical history and physical exam. How is this treated? Corns and calluses may be treated with:  Removing the cause of the friction or pressure. This may include: ? Changing your shoes. ? Wearing shoe inserts (orthotics) or other protective layers in your shoes, such as a corn pad. ? Wearing gloves.  Medicines to help soften skin in the hardened, thickened areas.  Reducing the size of the corn or callus by removing the dead layers of skin.  Antibiotic medicines to treat infection.  Surgery, if a toe deformity is the cause.  Follow these instructions at home:  Take medicines only as directed by your health care provider.  If you were prescribed an antibiotic, finish all of it even if you start to feel better.  Wear  shoes that fit well. Avoid wearing high-heeled shoes and shoes that are too tight or too loose.  Wear any padding, protective layers, gloves, or orthotics as directed by your health care provider.  Soak your hands or feet and then use a file or pumice stone to soften your corn or callus. Do this as directed by your health care provider.  Check your corn or callus every day for signs of infection. Watch for: ? Redness, swelling, or pain. ? Fluid, blood, or pus. Contact a health care provider if:  Your symptoms do not improve with treatment.  You have increased redness, swelling, or pain at the site of your corn or callus.  You have fluid, blood, or pus coming from your corn or callus.  You have new symptoms. This information is not intended to replace advice given to you by your health care provider. Make sure you discuss any questions you have with your health care provider. Document Released: 06/29/2004 Document Revised: 04/12/2016 Document Reviewed: 09/19/2014 Elsevier Interactive Patient Education  2018 [employer]. Onychomycosis/Fungal Toenails  WHAT IS IT? An infection that lies within the keratin of your nail plate that is caused by a fungus.  WHY ME? Fungal infections affect all ages, sexes, races, and creeds.  There may be many factors that predispose you to a fungal infection such as age, coexisting medical conditions such as diabetes, or an autoimmune disease; stress, medications, fatigue, genetics, etc.  Bottom line: fungus thrives in a warm, moist environment   and your shoes offer such a location.  IS IT CONTAGIOUS? Theoretically, yes.  You do not want to share shoes, nail clippers or files with someone who has fungal toenails.  Walking around barefoot in the same room or sleeping in the same bed is unlikely to transfer the organism.  It is important to realize, however, that fungus can spread easily from one nail to the next on the same foot.  HOW DO WE TREAT THIS?  There  are several ways to treat this condition.  Treatment may depend on many factors such as age, medications, pregnancy, liver and kidney conditions, etc.  It is best to ask your doctor which options are available to you.  1. No treatment.   Unlike many other medical concerns, you can live with this condition.  However for many people this can be a painful condition and may lead to ingrown toenails or a bacterial infection.  It is recommended that you keep the nails cut short to help reduce the amount of fungal nail. 2. Topical treatment.  These range from herbal remedies to prescription strength nail lacquers.  About 40-50% effective, topicals require twice daily application for approximately 9 to 12 months or until an entirely new nail has grown out.  The most effective topicals are medical grade medications available through physicians offices. 3. Oral antifungal medications.  With an 80-90% cure rate, the most common oral medication requires 3 to 4 months of therapy and stays in your system for a year as the new nail grows out.  Oral antifungal medications do require blood work to make sure it is a safe drug for you.  A liver function panel will be performed prior to starting the medication and after the first month of treatment.  It is important to have the blood work performed to avoid any harmful side effects.  In general, this medication safe but blood work is required. 4. Laser Therapy.  This treatment is performed by applying a specialized laser to the affected nail plate.  This therapy is noninvasive, fast, and non-painful.  It is not covered by insurance and is therefore, out of pocket.  The results have been very good with a 80-95% cure rate.  [facility] is the only practice in the area to offer this therapy. 5. Permanent Nail Avulsion.  Removing the entire nail so that a new nail will not grow back.  

## 2018-10-17 ENCOUNTER — Encounter: Payer: Self-pay | Admitting: Podiatry

## 2018-10-17 NOTE — Progress Notes (Signed)
Subjective: Earl Campbell presents today with painful porokeratotic lesion left foot and  thick toenails 1-5 b/l that he cannot cut and which interfere with daily activities. Pain is aggravated when weightbearing with and without shoe gear. Pain resolves with periodic professional debridement.    [name], MD    Current Outpatient Medications:  .  aspirin 325 MG EC tablet, Take 325 mg by mouth daily., Disp: , Rfl:  .  clonazePAM (KLONOPIN) 0.5 MG tablet, , Disp: , Rfl:  .  lisinopril-hydrochlorothiazide (PRINZIDE,ZESTORETIC) 20-25 MG tablet, TAKE 1/2 TABLET BY MOUTH EVERY 24 HOIURS FOR HYPERTENSION, Disp: , Rfl: 4 .  MILK THISTLE PO, Take by mouth., Disp: , Rfl:  .  Multiple Vitamin (MULTIVITAMIN) tablet, Take by mouth., Disp: , Rfl:  .  Omega-3 1000 MG CAPS, Take by mouth., Disp: , Rfl:  .  predniSONE (DELTASONE) 10 MG tablet, , Disp: , Rfl:  .  tamsulosin (FLOMAX) 0.4 MG CAPS capsule, , Disp: , Rfl: 5  No Known Allergies  Objective:  Vascular Examination: Capillary refill time immediate x 10 digits  Dorsalis pedis and Posterior tibial pulses palpable b/l  Digital hair present x 10 digits  Skin temperature gradient WNL b/l  Dermatological Examination: Skin with normal turgor, texture and tone b/l  Toenails 1-5 b/l discolored, thick, dystrophic with subungual debris and pain with palpation to nailbeds due to thickness of nails.  Porokeratotic lesion noted submet head 5 left foot with tenderness to palpation. No erythema, no edema, no drainage.  Hyperkeratotic lesion medial hallux IPJ b/l  Musculoskeletal: Muscle strength 5/5 to all LE muscle groups  No gross bony deformities b/l.  No pain, crepitus or joint limitation noted with ROM.   Neurological: Sensation intact with 10 gram monofilament. Vibratory sensation intact.  Assessment: Painful onychomycosis toenails 1-5 b/l  Porokeratosis submet head 5 left foot Callus b/l great  toes  Plan: 1. Toenails 1-5 b/l were debrided in length and girth without iatrogenic bleeding. 2. Excisional debridement performed submet head 5 left foot  3. Calluses pared b/l great toes. 4. Patient to continue soft, supportive shoe gear 5. Patient to report any pedal injuries to medical professional immediately. 6. Follow up 3 months. Patient/POA to call should there be a concern in the interim.

## 2019-05-04 ENCOUNTER — Other Ambulatory Visit: Payer: Self-pay | Admitting: Family Medicine

## 2019-05-04 DIAGNOSIS — F17200 Nicotine dependence, unspecified, uncomplicated: Secondary | ICD-10-CM

## 2019-05-20 ENCOUNTER — Other Ambulatory Visit: Payer: Self-pay | Admitting: Family Medicine

## 2019-05-20 DIAGNOSIS — K920 Hematemesis: Secondary | ICD-10-CM

## 2019-05-21 ENCOUNTER — Other Ambulatory Visit: Payer: Self-pay

## 2019-05-21 ENCOUNTER — Other Ambulatory Visit: Payer: Self-pay | Admitting: Family Medicine

## 2019-05-21 ENCOUNTER — Ambulatory Visit
Admission: RE | Admit: 2019-05-21 | Discharge: 2019-05-21 | Disposition: A | Discharge status: Home / Self Care | Payer: 59 | Source: Ambulatory Visit | Attending: Family Medicine | Admitting: Family Medicine

## 2019-05-21 ENCOUNTER — Ambulatory Visit: Payer: BLUE CROSS/BLUE SHIELD

## 2019-05-21 DIAGNOSIS — R042 Hemoptysis: Secondary | ICD-10-CM

## 2019-05-21 DIAGNOSIS — R059 Cough, unspecified: Secondary | ICD-10-CM

## 2019-05-21 DIAGNOSIS — F17200 Nicotine dependence, unspecified, uncomplicated: Secondary | ICD-10-CM

## 2019-05-21 DIAGNOSIS — R05 Cough: Secondary | ICD-10-CM

## 2019-06-07 ENCOUNTER — Ambulatory Visit
Admission: RE | Admit: 2019-06-07 | Discharge: 2019-06-07 | Disposition: A | Discharge status: Home / Self Care | Payer: 59 | Source: Ambulatory Visit | Attending: Family Medicine | Admitting: Family Medicine

## 2019-06-07 ENCOUNTER — Other Ambulatory Visit: Payer: Self-pay | Admitting: Family Medicine

## 2019-06-07 ENCOUNTER — Other Ambulatory Visit: Payer: Self-pay

## 2019-06-07 DIAGNOSIS — R05 Cough: Secondary | ICD-10-CM

## 2019-06-07 DIAGNOSIS — F17200 Nicotine dependence, unspecified, uncomplicated: Secondary | ICD-10-CM

## 2019-06-07 DIAGNOSIS — R059 Cough, unspecified: Secondary | ICD-10-CM

## 2019-06-07 DIAGNOSIS — R042 Hemoptysis: Secondary | ICD-10-CM

## 2019-10-25 IMAGING — CR CHEST - 2 VIEW
2 series · 2 of 2 positions shown · non-contrast
Comparison: May 21, 2019

CLINICAL DATA: Hemoptysis.  Cough.  Smoker.

EXAM:
CHEST - 2 VIEW

[w chest pa]
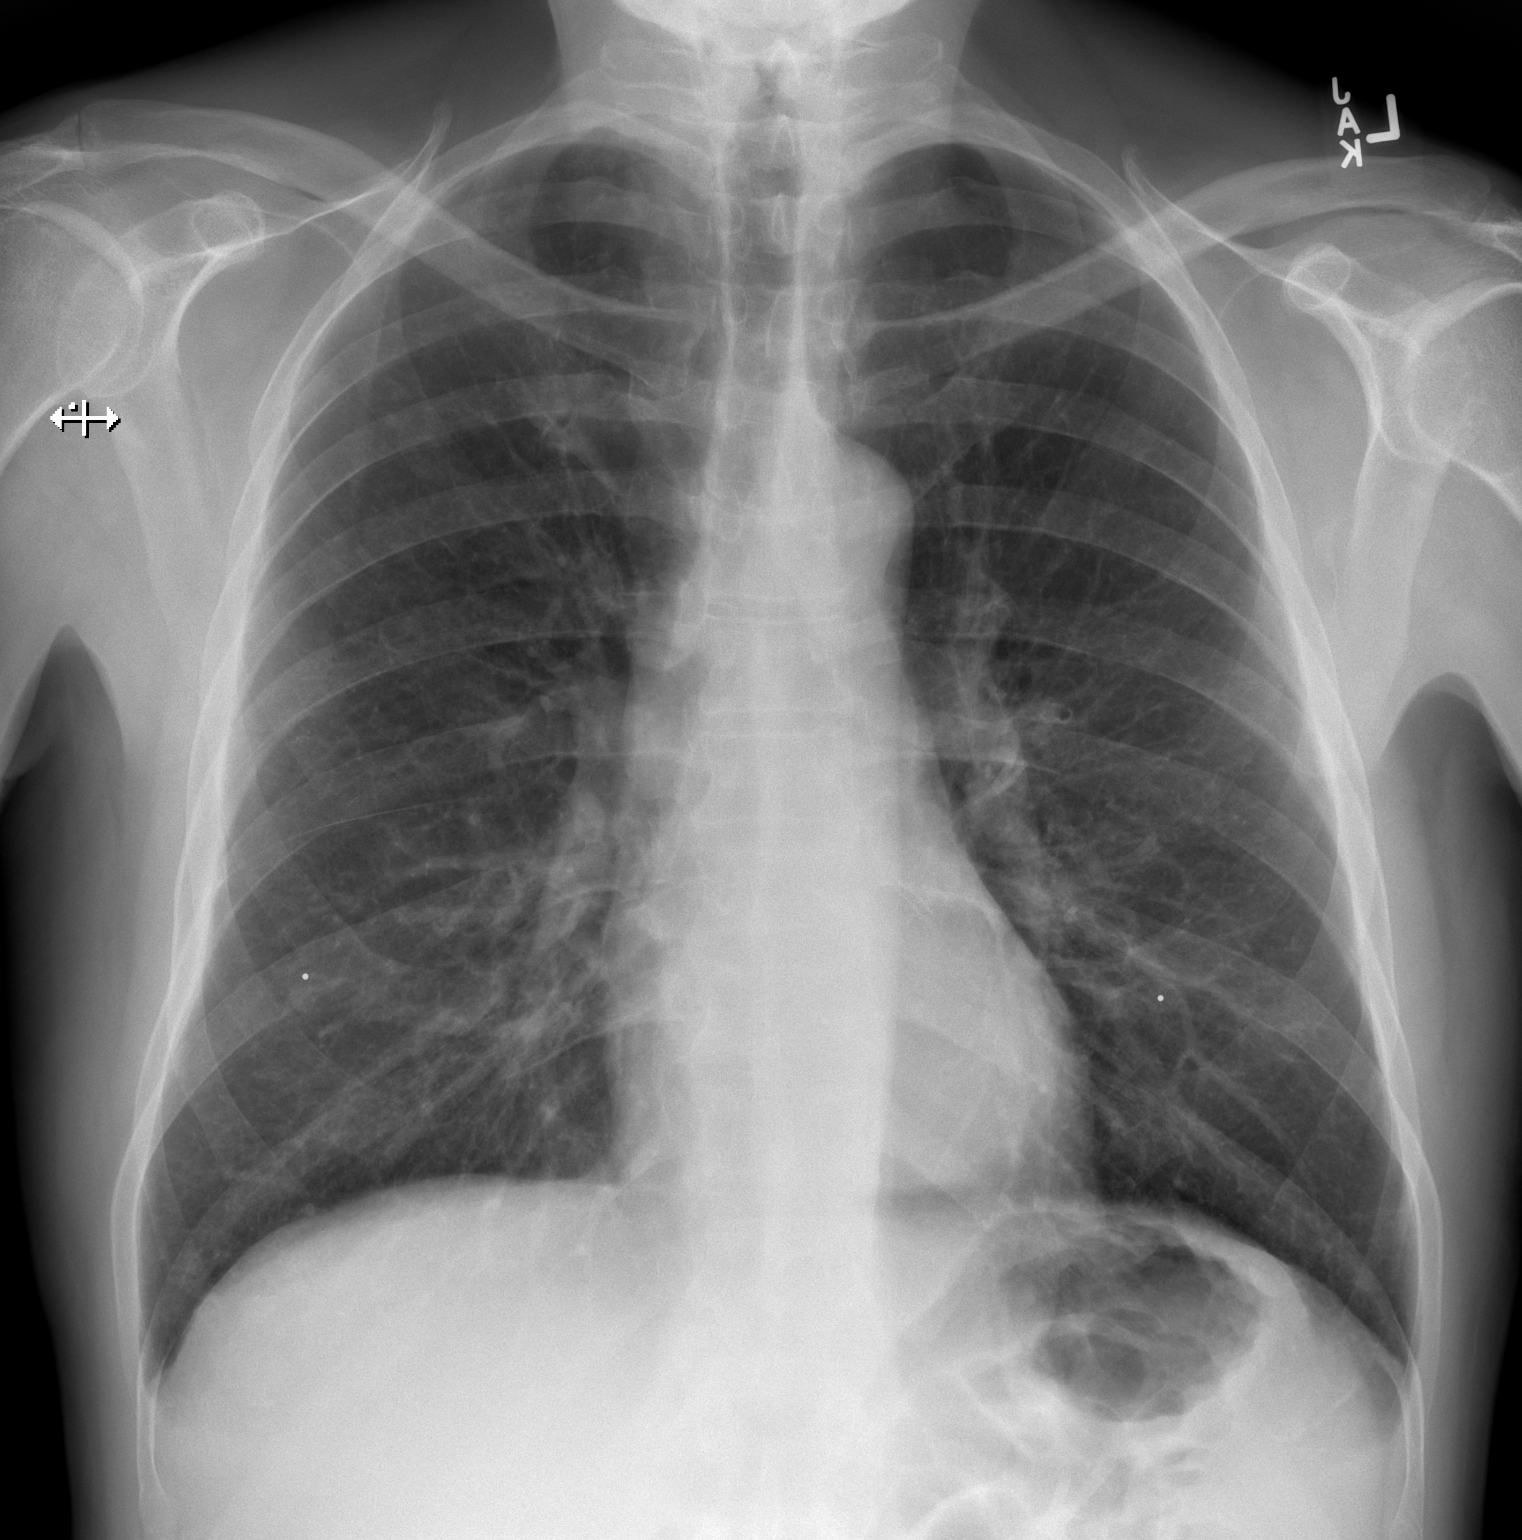

[w chest lat]
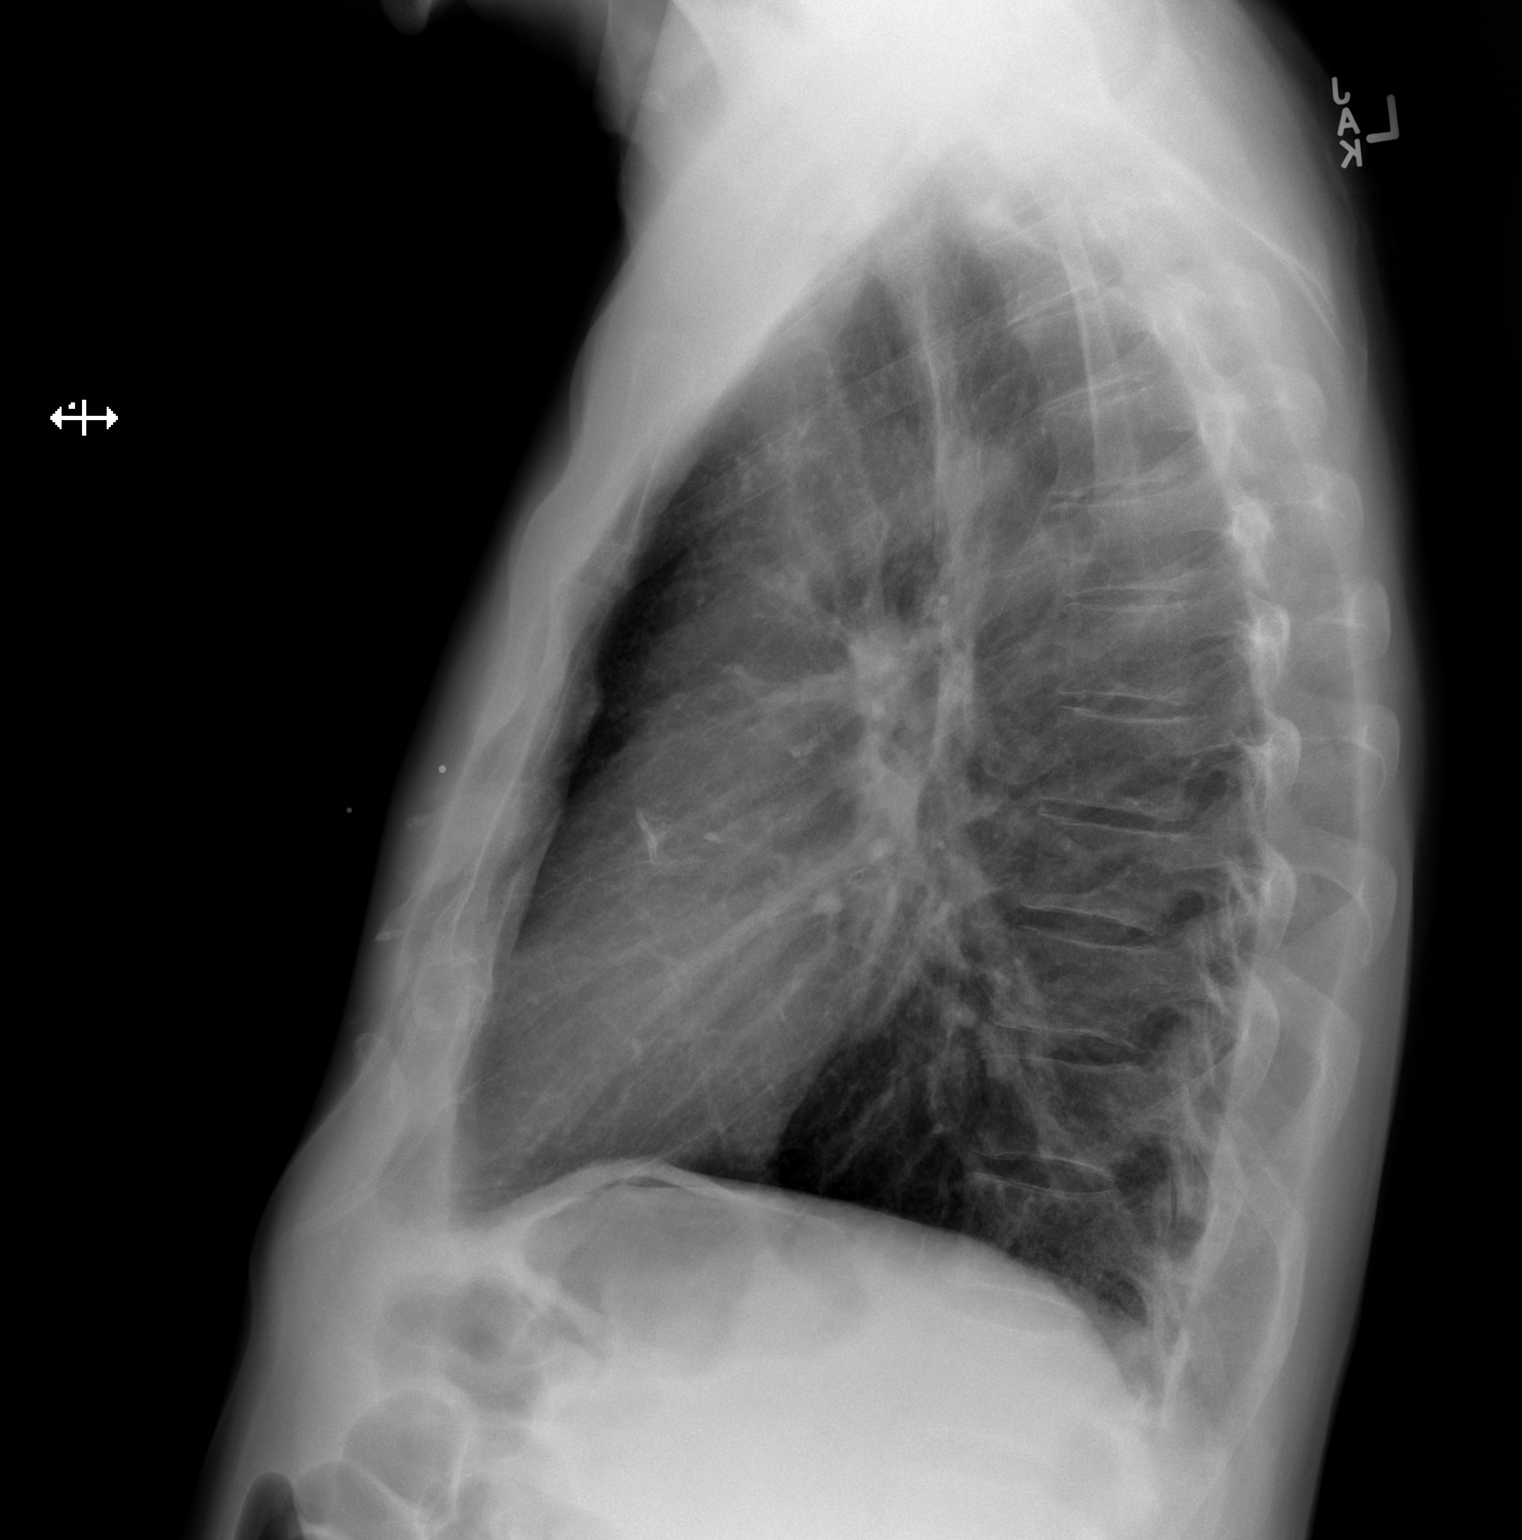

[2 of 2 positions shown; findings below may reference images not displayed]

FINDINGS: The nodule in the right lower lobe correlates with a nipple marker.
The heart, hila, and mediastinum are normal. No pneumothorax. No
suspicious nodules, masses, or infiltrates.
IMPRESSION: The previously identified nodular density over the right base
correlates with a nipple marker. No suspicious nodules or masses are
identified. Stable study.

## 2020-08-29 ENCOUNTER — Other Ambulatory Visit: Payer: Self-pay | Admitting: Gastroenterology

## 2020-08-29 DIAGNOSIS — R198 Other specified symptoms and signs involving the digestive system and abdomen: Secondary | ICD-10-CM

## 2020-09-19 ENCOUNTER — Ambulatory Visit: Admission: RE | Admit: 2020-09-19 | Payer: 59 | Source: Ambulatory Visit

## 2020-09-19 ENCOUNTER — Other Ambulatory Visit: Payer: 59

## 2020-09-21 ENCOUNTER — Other Ambulatory Visit: Payer: No Typology Code available for payment source

## 2020-10-11 ENCOUNTER — Other Ambulatory Visit: Payer: No Typology Code available for payment source

## 2020-10-13 ENCOUNTER — Ambulatory Visit
Admission: RE | Admit: 2020-10-13 | Discharge: 2020-10-13 | Disposition: A | Discharge status: Home / Self Care | Payer: No Typology Code available for payment source | Source: Ambulatory Visit | Attending: Gastroenterology | Admitting: Gastroenterology

## 2020-10-13 DIAGNOSIS — R198 Other specified symptoms and signs involving the digestive system and abdomen: Secondary | ICD-10-CM

## 2020-10-13 MED ORDER — IOPAMIDOL (ISOVUE-300) INJECTION 61%
100.0000 mL | Freq: Once | INTRAVENOUS | Status: AC | PRN
  Administered 2020-10-13: 80 mL via INTRAVENOUS

## 2021-05-21 ENCOUNTER — Other Ambulatory Visit: Payer: Self-pay

## 2021-05-21 ENCOUNTER — Ambulatory Visit
Admission: EM | Admit: 2021-05-21 | Discharge: 2021-05-21 | Disposition: A | Discharge status: Home / Self Care | Payer: No Typology Code available for payment source | Attending: Emergency Medicine | Admitting: Emergency Medicine

## 2021-05-21 ENCOUNTER — Encounter: Payer: Self-pay | Admitting: Emergency Medicine

## 2021-05-21 DIAGNOSIS — B349 Viral infection, unspecified: Secondary | ICD-10-CM

## 2021-05-21 MED ORDER — GUAIFENESIN-DM 100-10 MG/5ML PO SYRP: 5.0000 mL | ORAL_SOLUTION | ORAL | 0 refills | Status: DC | PRN

## 2021-05-21 MED ORDER — BENZONATATE 100 MG PO CAPS: 100.0000 mg | ORAL_CAPSULE | Freq: Three times a day (TID) | ORAL | 0 refills | Status: DC

## 2021-05-21 NOTE — Discharge Instructions (Signed)
Your COVID test may take up to 2 days to result, you will receive a phone call if positive, if positive follow CDC guidelines for quarantine, which 5 days for those vaccinated from the start of symptoms  You can use tessalon every 8 hours to help with coughing  You may use 5 mL of cough syrup every 4 hours to help with cough and congestion   You may return for reevaluation if coughing persist or worsens, new symptoms appear or you continue to feel unwell

## 2021-05-21 NOTE — ED Provider Notes (Signed)
[facility]    CSN: [id number] Arrival date & time: 05/21/21  1100      History   Chief Complaint Chief Complaint  Patient presents with   Cough    HPI Earl Campbell is a 60 y.o. male.   Patient presents with non productive cough, nasal congestion and rhinorrhea for 2 weeks. Denies headache, sore throat, shortness of breath, wheezing, chest pain or soreness. Tolerating food and liquids. Everyday smoker, currently unable to tolerate cigarette, makes it difficult to breath. Brother whom he lives with COVID positive two weeks ago.  History of COPD, HTN, HLD, ETOH abuse. Using aspirin for management.   Past Medical History:  Diagnosis Date   CAD S/P percutaneous coronary angioplasty 07/2007    abnormal nuclear ST --> Cath --> 80%mLAD - PCI with Promus DES 3.0 mm x 23 mm (~3.2 mm).[place], [place].  - Patent stent with ~30% RCA on relook cath 04/2008   COPD (chronic obstructive pulmonary disease) (HCC)    Hyperlipidemia    Hypertension     Patient Active Problem List   Diagnosis Date Noted   Preop cardiovascular exam 11/24/2017   Nondependent alcohol abuse, continuous drinking behavior 07/27/2013   Hepatitis C antibody test positive 07/27/2013   Anxiety disorder 07/27/2013   CAD S/P percutaneous coronary angioplasty 12/29/2012   Essential hypertension 12/29/2012   Hyperlipidemia 12/29/2012   Tobacco abuse 12/29/2012    Past Surgical History:  Procedure Laterality Date   CARDIAC CATHETERIZATION  07/30/2007   (Dr. Marquette Saa --[facility] [place], [place]): abnormal Nuclear ST ordered to evaluate chest pain- 80% mLAD just after the major diagonal--> PCI; small ramus intermedius noted.  That the circumflex are relatively normal.  Dominant RCA relatively normal.  EF 70%.   CARDIAC CATHETERIZATION  04/2008   Shirlee Latch [place], [place]; Patent mLAD stent, 30%.  EF at least 55%.   CORONARY STENT PLACEMENT  07/30/2007    PCI - mLAD - Promus DES 3.0 mm x 23 mm (3.2 mm).   TONSILLECTOMY         Home Medications    Prior to Admission medications   Medication Sig Start Date End Date Taking? Authorizing Provider  aspirin 325 MG EC tablet Take 325 mg by mouth daily.   Yes [provider]  benzonatate (TESSALON) 100 MG capsule Take 1 capsule (100 mg total) by mouth every 8 (eight) hours. 05/21/21  Yes Valinda Hoar, NP  clonazePAM (KLONOPIN) 0.5 MG tablet  06/28/13  Yes [provider]  guaiFENesin-dextromethorphan (ROBITUSSIN DM) 100-10 MG/5ML syrup Take 5 mLs by mouth every 4 (four) hours as needed for cough. 05/21/21  Yes Beonka Amesquita R, NP  lisinopril-hydrochlorothiazide (PRINZIDE,ZESTORETIC) 20-25 MG tablet TAKE 1/2 TABLET BY MOUTH EVERY 24 HOIURS FOR HYPERTENSION 10/21/17  Yes [provider]  MILK THISTLE PO Take by mouth.    [provider]  Multiple Vitamin (MULTIVITAMIN) tablet Take by mouth.    [provider]  Omega-3 1000 MG CAPS Take by mouth.    [provider]  predniSONE (DELTASONE) 10 MG tablet  06/20/13   [provider]  tamsulosin (FLOMAX) 0.4 MG CAPS capsule  11/19/17   [provider]    Family History Family History  Problem Relation Age of Onset   Heart disease Unknown        No family history   Hypertension Mother        Was also a smoker  Hypertension Father    Heart disease Brother        Is a patient of Dr. Olga Millers    Social History Social History   Tobacco Use   Smoking status: Every Day    Packs/day: 0.50    Years: 30.00    Pack years: 15.00    Types: Cigarettes   Smokeless tobacco: Never   Tobacco comments:    Has cut down from 1 pack/day  Substance Use Topics   Alcohol use: Yes    Comment: 6-12 beers per night   Drug use: No     Allergies   Patient has no known allergies.   Review of Systems Review of Systems  Constitutional: Negative.   HENT:  Positive for congestion  and rhinorrhea. Negative for dental problem, drooling, ear discharge, ear pain, facial swelling, hearing loss, mouth sores, nosebleeds, postnasal drip, sinus pressure, sinus pain, sneezing, sore throat, tinnitus, trouble swallowing and voice change.   Respiratory:  Positive for cough. Negative for apnea, choking, chest tightness, shortness of breath, wheezing and stridor.   Cardiovascular: Negative.   Skin: Negative.   Neurological: Negative.     Physical Exam Triage Vital Signs ED Triage Vitals  Enc Vitals Group     BP 05/21/21 1107 (!) 193/86     Pulse Rate 05/21/21 1107 82     Resp 05/21/21 1107 18     Temp 05/21/21 1107 (!) 97.5 F (36.4 C)     Temp Source 05/21/21 1107 Oral     SpO2 05/21/21 1107 97 %     Weight 05/21/21 1110 175 lb (79.4 kg)     Height 05/21/21 1110 6' (1.829 m)     Head Circumference --      Peak Flow --      Pain Score 05/21/21 1109 0     Pain Loc --      Pain Edu? --      Excl. in GC? --    No data found.  Updated Vital Signs BP (!) 193/86 (BP Location: Left Arm)   Pulse 82   Temp (!) 97.5 F (36.4 C) (Oral)   Resp 18   Ht 6' (1.829 m)   Wt 175 lb (79.4 kg)   SpO2 97%   BMI 23.73 kg/m   Visual Acuity Right Eye Distance:   Left Eye Distance:   Bilateral Distance:    Right Eye Near:   Left Eye Near:    Bilateral Near:     Physical Exam Constitutional:      Appearance: Normal appearance. He is normal weight.  HENT:     Head: Normocephalic.     Right Ear: Tympanic membrane, ear canal and external ear normal.     Left Ear: Tympanic membrane, ear canal and external ear normal.     Nose: Congestion and rhinorrhea present.     Mouth/Throat:     Mouth: Mucous membranes are moist.     Pharynx: Oropharynx is clear.  Eyes:     Extraocular Movements: Extraocular movements intact.  Cardiovascular:     Rate and Rhythm: Normal rate.     Pulses: Normal pulses.     Heart sounds: Normal heart sounds.  Pulmonary:     Effort: Pulmonary effort  is normal.     Breath sounds: Wheezing present.  Skin:    General: Skin is warm and dry.  Neurological:     Mental Status: He is alert and oriented to person, place, and time. Mental status  is at baseline.  Psychiatric:        Mood and Affect: Mood normal.        Behavior: Behavior normal.     UC Treatments / Results  Labs (all labs ordered are listed, but only abnormal results are displayed) Labs Reviewed  NOVEL CORONAVIRUS, NAA    EKG   Radiology No results found.  Procedures Procedures (including critical care time)  Medications Ordered in UC Medications - No data to display  Initial Impression / Assessment and Plan / UC Course  I have reviewed the triage vital signs and the nursing notes.  Pertinent labs & imaging results that were available during my care of the patient were reviewed by me and considered in my medical decision making (see chart for details).  Viral illness  Low suspicion for Pneumonia, wheezing and cough most likely related to virus, smoking and COPD, discussed with patient   Covid test pending Tessalon 100 mg tid prn Robitussin DM 100-10 mg/5 mL every 4 hours prn Discouraged use of aspirin  for symptom management  Return precautions given for persistent or worsening symptoms  Final Clinical Impressions(s) / UC Diagnoses   Final diagnoses:  Viral illness     Discharge Instructions      Your COVID test may take up to 2 days to result, you will receive a phone call if positive, if positive follow CDC guidelines for quarantine, which 5 days for those vaccinated from the start of symptoms  You can use tessalon every 8 hours to help with coughing  You may use 5 mL of cough syrup every 4 hours to help with cough and congestion   You may return for reevaluation if coughing persist or worsens, new symptoms appear or you continue to feel unwell     ED Prescriptions     Medication Sig Dispense Auth. Provider   benzonatate (TESSALON) 100  MG capsule Take 1 capsule (100 mg total) by mouth every 8 (eight) hours. 21 capsule Shawanda Sievert R, NP   guaiFENesin-dextromethorphan (ROBITUSSIN DM) 100-10 MG/5ML syrup Take 5 mLs by mouth every 4 (four) hours as needed for cough. 118 mL Elliott Lasecki, Elita Boone, NP      PDMP not reviewed this encounter.   Valinda Hoar, NP 05/21/21 1136

## 2021-05-21 NOTE — ED Triage Notes (Signed)
Patient c/o non-productive cough for several weeks, nasal drainage.  Brother was diagnosed with COVID.  Patient is vaccinated for COVID.

## 2021-05-22 LAB — SARS-COV-2, NAA 2 DAY TAT

## 2021-05-22 LAB — NOVEL CORONAVIRUS, NAA: SARS-CoV-2, NAA: DETECTED — AB

## 2021-06-19 ENCOUNTER — Ambulatory Visit (INDEPENDENT_AMBULATORY_CARE_PROVIDER_SITE_OTHER): Payer: No Typology Code available for payment source | Admitting: Orthopedic Surgery

## 2021-06-19 DIAGNOSIS — L97511 Non-pressure chronic ulcer of other part of right foot limited to breakdown of skin: Secondary | ICD-10-CM

## 2021-06-19 DIAGNOSIS — L97521 Non-pressure chronic ulcer of other part of left foot limited to breakdown of skin: Secondary | ICD-10-CM

## 2021-06-20 ENCOUNTER — Encounter: Payer: Self-pay | Admitting: Orthopedic Surgery

## 2021-06-20 NOTE — Progress Notes (Signed)
Office Visit Note   Patient: Earl Campbell           Date of Birth: [birth year]           MRN: [id number] Visit Date: 06/19/2021              Requested by: [name], MD [address] [place],  [place] [address] PCP: [name], MD  Chief Complaint  Patient presents with   Right Foot - Pain   Left Foot - Pain      HPI: Patient is a 60 year old gentleman who presents complaining of bilateral foot pain callus and ulceration.  Pain greater than the right than the left foot.  Patient states he has tried to file the lesions down without success.  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  Past medical history negative for diabetes.  Assessment & Plan: Visit Diagnoses:  1. Non-pressure chronic ulcer of other part of left foot limited to breakdown of skin (HCC)   2. Non-pressure chronic ulcer of other part of right foot limited to breakdown of skin (HCC)     Plan: The ulcers and calluses were debrided of skin and soft tissue without complication no signs of infections.  Reevaluate in 4 weeks.  Follow-Up Instructions: Return in about 4 weeks (around 07/17/2021).   Ortho Exam  Patient is alert, oriented, no adenopathy, well-dressed, normal affect, normal respiratory effort. Examination patient has good pulses.  He has massive callus and ulceration over the inferior medial border of the great toes bilaterally there is also an ulcer base of the fifth metatarsal of the left foot.  After informed consent a 10 blade knife was used to debride the skin and soft tissue back to healthy viable tissue there is no deep abscess no exposed bone or tendon no signs of infection.  The wounds were approximately 1 cm diameter prior to debridement after debridement the right great toe ulcerative area was 3 cm in diameter and 3 mm deep the left great toe ulcer was 2 cm in diameter after debridement and 2 mm deep in the base of the fifth metatarsal ulcer  on the left was 1 cm diameter 1 mm deep.  Imaging: No results found. No images are attached to the encounter.  Labs: No results found for: HGBA1C, ESRSEDRATE, CRP, LABURIC, REPTSTATUS, GRAMSTAIN, CULT, LABORGA   Lab Results  Component Value Date   ALBUMIN 3.7 03/19/2013   ALBUMIN 4.0 12/29/2012    No results found for: MG No results found for: VD25OH  No results found for: PREALBUMIN No flowsheet data found.   There is no height or weight on file to calculate BMI.  Orders:  No orders of the defined types were placed in this encounter.  No orders of the defined types were placed in this encounter.    Procedures: No procedures performed  Clinical Data: No additional findings.  ROS:  All other systems negative, except as noted in the HPI. Review of Systems  Objective: Vital Signs: There were no vitals taken for this visit.  Specialty Comments:  No specialty comments available.  PMFS History: Patient Active Problem List   Diagnosis Date Noted   Preop cardiovascular exam 11/24/2017   Nondependent alcohol abuse, continuous drinking behavior 07/27/2013   Hepatitis C antibody test positive 07/27/2013   Anxiety disorder 07/27/2013   CAD S/P percutaneous coronary angioplasty 12/29/2012   Essential hypertension 12/29/2012   Hyperlipidemia 12/29/2012  Tobacco abuse 12/29/2012   Past Medical History:  Diagnosis Date   CAD S/P percutaneous coronary angioplasty 07/2007    abnormal nuclear ST --> Cath --> 80%mLAD - PCI with Promus DES 3.0 mm x 23 mm (~3.2 mm).[place], [place].  - Patent stent with ~30% RCA on relook cath 04/2008   COPD (chronic obstructive pulmonary disease) (HCC)    Hyperlipidemia    Hypertension     Family History  Problem Relation Age of Onset   Heart disease Unknown        No family history   Hypertension Mother        Was also a smoker   Hypertension Father    Heart disease Brother        Is a patient of Dr. Olga Millers    Past  Surgical History:  Procedure Laterality Date   CARDIAC CATHETERIZATION  07/30/2007   (Dr. Marquette Saa --[facility] [place], [place]): abnormal Nuclear ST ordered to evaluate chest pain- 80% mLAD just after the major diagonal--> PCI; small ramus intermedius noted.  That the circumflex are relatively normal.  Dominant RCA relatively normal.  EF 70%.   CARDIAC CATHETERIZATION  04/2008   Shirlee Latch [place], [place]; Patent mLAD stent, 30%.  EF at least 55%.   CORONARY STENT PLACEMENT  07/30/2007   PCI - mLAD - Promus DES 3.0 mm x 23 mm (3.2 mm).   TONSILLECTOMY     Social History   Occupational History   Occupation: Airline pilot work    Comment: Merchandiser, retail  Tobacco Use   Smoking status: Every Day    Packs/day: 0.50    Years: 30.00    Pack years: 15.00    Types: Cigarettes   Smokeless tobacco: Never   Tobacco comments:    Has cut down from 1 pack/day  Substance and Sexual Activity   Alcohol use: Yes    Comment: 6-12 beers per night   Drug use: No   Sexual activity: Not on file

## 2021-07-17 ENCOUNTER — Ambulatory Visit: Payer: No Typology Code available for payment source | Admitting: Family

## 2022-06-28 ENCOUNTER — Other Ambulatory Visit: Payer: Self-pay | Admitting: Family Medicine

## 2022-06-28 ENCOUNTER — Ambulatory Visit
Admission: RE | Admit: 2022-06-28 | Discharge: 2022-06-28 | Disposition: A | Discharge status: Home / Self Care | Payer: No Typology Code available for payment source | Source: Ambulatory Visit | Attending: Family Medicine | Admitting: Family Medicine

## 2022-06-28 DIAGNOSIS — W19XXXA Unspecified fall, initial encounter: Secondary | ICD-10-CM

## 2022-06-28 DIAGNOSIS — M2560 Stiffness of unspecified joint, not elsewhere classified: Secondary | ICD-10-CM

## 2022-08-01 ENCOUNTER — Ambulatory Visit: Payer: No Typology Code available for payment source | Attending: Neurosurgery | Admitting: Physical Therapy

## 2022-08-01 ENCOUNTER — Other Ambulatory Visit: Payer: Self-pay

## 2022-08-01 ENCOUNTER — Encounter: Payer: Self-pay | Admitting: Physical Therapy

## 2022-08-01 DIAGNOSIS — R293 Abnormal posture: Secondary | ICD-10-CM

## 2022-08-01 DIAGNOSIS — M542 Cervicalgia: Secondary | ICD-10-CM | POA: Diagnosis not present

## 2022-08-01 DIAGNOSIS — M6281 Muscle weakness (generalized): Secondary | ICD-10-CM | POA: Diagnosis not present

## 2022-08-01 DIAGNOSIS — R2689 Other abnormalities of gait and mobility: Secondary | ICD-10-CM

## 2022-08-01 DIAGNOSIS — R29898 Other symptoms and signs involving the musculoskeletal system: Secondary | ICD-10-CM | POA: Diagnosis present

## 2022-08-01 NOTE — Therapy (Signed)
OUTPATIENT PHYSICAL THERAPY CERVICAL EVALUATION   Patient Name: Earl Campbell MRN: [id number] DOB:[birth year], 61 y.o., male Today's Date: 08/01/2022   PT End of Session - 08/01/22 1025     Visit Number 1    Number of Visits 12    Date for PT Re-Evaluation 09/26/22    Authorization Type [employer]    Authorization Time Period 05/07/22-05/07/23    Authorization - Visit Number 1    Authorization - Number of Visits 20   per fiscal year   Progress Note Due on Visit 10    PT Start Time 1028   late arrival   PT Stop Time 1114    PT Time Calculation (min) 46 min    Activity Tolerance Patient tolerated treatment well    Behavior During Therapy [facility] for tasks assessed/performed             Past Medical History:  Diagnosis Date   CAD S/P percutaneous coronary angioplasty 07/2007    abnormal nuclear ST --> Cath --> 80%mLAD - PCI with Promus DES 3.0 mm x 23 mm (~3.2 mm).[place], [place] stent with ~30% RCA on relook cath 04/2008   COPD (chronic obstructive pulmonary disease) ([place])    Hyperlipidemia    Hypertension    Past Surgical History:  Procedure Laterality Date   CARDIAC CATHETERIZATION  07/30/2007   (Dr. Monica Martinez --[facility] [place], [place]): abnormal Nuclear ST ordered to evaluate chest pain- 80% mLAD just after the major diagonal--> PCI; small ramus intermedius noted.  That the circumflex are relatively normal.  Dominant RCA relatively normal.  EF 70%.   CARDIAC CATHETERIZATION  04/2008   [place], [place]; Patent mLAD stent, 30%.  EF at least 55%.   CORONARY STENT PLACEMENT  07/30/2007   PCI - mLAD - Promus DES 3.0 mm x 23 mm (3.2 mm).   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 He states that weakness is severe but consistent, non worsening. Reports constant pain - is able to do work activities and daily activities with modification, he is left hand dominant and has to use one UE to support head with activities. Pt denies any focal weakness or incoordination since injury. Mentions he feels general weakness and "clumsiness" as he has aged, nothing new. Pt denies any headaches, visual changes, difficulty swallowing, or UE numbness. Has been using soft collar for comfort but states it is difficult to perform activities with it on.   PERTINENT HISTORY:  CAD s/p stent, HTN, anxiety, HEP C, ETOH; recent fall landing on face  PAIN:  Are you having pain: 9/10 Location: upper/middle neck How would you describe your pain? "Just hurts" Best in past week: unclear Worst in past week:  9/10 Aggravating factors: carrying, bending, lifting, sitting, using UEs Easing factors: soft collar, holding head up, drinking beer/medication  PRECAUTIONS: none (has been using soft collar for comfort)  WEIGHT BEARING RESTRICTIONS: No  FALLS:  Has patient fallen in last 6 months? 1 fall, precipitating episode. Notes some "clumsiness" with navigating body shop and stairs but denies any overt changes since fall  LIVING ENVIRONMENT: Lives alone - just moved to one level apartment. A couple steps to enter no rails, pt states it is difficult to enter  OCCUPATION: works in [employer], has to be very active for jobs   PLOF: Independent  PATIENT GOALS: wants neck back to normal  OBJECTIVE:   DIAGNOSTIC FINDINGS:  06/28/22 Cervical XR:  "IMPRESSION: No definite recent fracture is seen. There is reversal of lordosis limiting evaluation. There is minimal anterolisthesis at C3-C4 and C4-C5 levels. Degenerative changes are noted with bony spurs and facet hypertrophy."  PATIENT SURVEYS:  FOTO: deferred on eval due to time constraints, pt late arrival  COGNITION: Overall cognitive status: Within functional limits for tasks assessed  SENSATION/NEURO: Light touch intact B UE/LE Finger<>chin grossly WNL and symmetrical B UE  POSTURE: significant forward head, only able to achieve neutral passively. Elevated UT B, L more than R. Increased thoracic kyphosis  PALPATION: No TTP but significant palpable tension B UT, LS, and paracervical musculature. No bony tenderness or deformity. Pt reports significant relief with STM   CERVICAL ROM:   Active ROM A/PROM (deg) eval  Flexion Able to bring chin to chest  Extension No active cervical extension, see comments  Right lateral flexion ~25%  Left lateral flexion ~10%  Right rotation 18  Left rotation 20   (Blank rows = not tested) Comments: mild pain with B rotation, about equal; for cervical extension - no movement against gravity although  compensations noted at T spine. Passively able to achieve neutral in seated position, noted ability to extend past neutral in gravity eliminated position Rotation/sidebending measurements taken in pt resting position (notably forward head)  UPPER EXTREMITY ROM:  Active ROM Right eval Left eval  Shoulder flexion [facility]  Shoulder abduction [facility]  Shoulder internal rotation    Shoulder external rotation    Elbow flexion [facility] WFL  Elbow extension [facility]  Wrist flexion [facility] WFL  Wrist extension WFL WFL   (Blank rows = not tested) Comments:elevation WNL B and painless  UPPER EXTREMITY MMT:  MMT Right eval Left eval  Shoulder flexion 5 4+  Shoulder extension    Shoulder  abduction 5 5  Shoulder extension    Shoulder internal rotation    Shoulder external rotation    Elbow flexion 5 5  Elbow extension 4 4  Grip strength 52# 64#   (Blank rows = not tested)  Comments: Pt left hand dominant at baseline  FUNCTIONAL TESTS:  Grip strength as above Gait: No AD, completely independent within clinic. Widened BOS, reduced truncal ROM and arm swing. Minimal hip extension B. Partial step through pattern. No overt  incoordination/ataxia   TODAY'S TREATMENT: [facility] Adult PT Treatment:                                                DATE: 08/01/22 Therapeutic Exercise: Seated scap retraction x10 - cues for reduced compensations, encouraged performing in soft collar at home for improved head posture, education on HEP Supine cervical flex/ext with pillow prop x20, cues/education for appropriate support, positioning, HEP performance Manual Therapy: Brief STM to UT/LS, seated, pt reports notable relief   PATIENT EDUCATION:  Education details: Pt education on PT impairments, prognosis, and POC. Informed consent. Rationale for interventions, safe/appropriate HEP performance Person educated: Patient Education method: Explanation, Demonstration, Tactile cues, Verbal cues, and Handouts Education  comprehension: verbalized understanding, returned demonstration, verbal cues required, tactile cues required, and needs further education   HOME EXERCISE PROGRAM: Access Code: [email or web address] URL: [email or web address] Date: 08/01/2022 Prepared by: Enis Slipper  Exercises - Seated Scapular Retraction  - 1 x daily - 7 x weekly - 3 sets - 10 reps - Supine Cervical Flexion Extension on Pillow  - 1 x daily - 7 x weekly - 3 sets - 10 reps  ASSESSMENT:  CLINICAL IMPRESSION: Pt is a 61 year old gentleman who arrives to PT evaluation on this date for neck pain/UE weakness after falling a few weeks ago, cervical XR negative for fracture. Pt reports constant pain and is having to modify all activities to accommodate for cervical weakness - high PLOF, works full time in [employer]. Has continued typical activities with modifications/pain. On examination pt demonstrates gait impairments as above, brief neuro screen unremarkable. Pt also demonstrates limitations in active and passive cervical mobility, palpable tightness to UT/LS and cervical paraspinals that improves with brief STM. Most notable impairment is significant weakness of cervical extensors and postural musculature that improves in gravity eliminated position. Pt tolerates HEP well without adverse events, cues for appropriate performance in clinic and at home. Departs with report of improved pain after HEP and brief STM. Recommend skilled PT to address aforementioned deficits to improve functional independence/tolerance.  Pt departs today's session in no acute distress, all voiced questions/concerns addressed appropriately from PT perspective.     OBJECTIVE IMPAIRMENTS: Abnormal gait, decreased activity tolerance, decreased mobility, difficulty walking, decreased ROM, decreased strength, hypomobility, increased muscle spasms, impaired flexibility, impaired UE functional use, improper body mechanics, postural dysfunction, and pain.    ACTIVITY LIMITATIONS: carrying, lifting, bending, sitting, standing, stairs, dressing, and reach over head  PARTICIPATION LIMITATIONS: driving, shopping, community activity, and occupation  PERSONAL FACTORS: Age, Profession, Time since onset of injury/illness/exacerbation, and 3+ comorbidities: HTN, anxiety, CAD s/p stent  are also affecting patient's functional outcome.   REHAB POTENTIAL: Fair given severity of deficits and comorbidities  CLINICAL DECISION MAKING: Evolving/moderate complexity  EVALUATION COMPLEXITY: Moderate   GOALS: Goals reviewed with patient? No given time constraints  SHORT TERM GOALS: Target  date: 08/22/2022   Pt will demonstrate appropriate understanding and performance of initially prescribed HEP in order to facilitate improved independence with management of symptoms.  Baseline: HEP provided on eval Goal status: INITIAL   2. Pt will improve greater than MCID on FOTO in order to demonstrate improved perception of function due to symptoms.  Baseline: deferred on eval given time constraints  Goal status: INITIAL   LONG TERM GOALS: Target date: 09/12/2022  Pt will improve greater than 2x MCID on FOTO in order to demonstrate improved perception of function due to symptoms. Baseline: deferred on eval given time constraints Goal status: INITIAL  Pt will demonstrate ability to maintain grossly neutral sitting posture for >31min with less than 6/10 pain on NPS in order to indicate improved functional tolerance of postural musculature. Baseline: profound forward head posture, no active cervical extension against gravity, 9/10 pain NPS Goal status: INITIAL  Pt will demonstrate ability to actively extend cervical spine to neutral with <6/10 pain on NPS in order to improve environmental awareness/safety with activity.  Baseline: see ROM chart above Goal status: INITIAL   Pt will report/demonstrate ability to work overhead with B UE for >76min continuously with  less than 2 point increase from baseline on NPS in order to demonstrate improved tolerance to work activities.  Baseline: Requires unilateral UE supporting head for any activities at eye level or above Goal status: INITIAL   PLAN:  PT FREQUENCY: 1-2x/week  PT DURATION: 6 weeks  PLANNED INTERVENTIONS: Therapeutic exercises, Therapeutic activity, Neuromuscular re-education, Balance training, Gait training, Patient/Family education, Self Care, Joint mobilization, Stair training, Dry Needling, Electrical stimulation, Spinal mobilization, Cryotherapy, Moist heat, Taping, Manual therapy, and Re-evaluation  PLAN FOR NEXT SESSION: Progress ROM/strengthening exercises as able/appropriate, review HEP. Likely emphasis initially on gravity reduced/eliminated activation of cervical extensors and postural musculature   Leeroy Cha PT, DPT 08/01/2022 1:10 PM

## 2022-08-06 ENCOUNTER — Encounter: Payer: Self-pay | Admitting: Physical Therapy

## 2022-08-06 ENCOUNTER — Ambulatory Visit: Payer: No Typology Code available for payment source | Admitting: Physical Therapy

## 2022-08-06 ENCOUNTER — Other Ambulatory Visit: Payer: Self-pay | Admitting: Neurosurgery

## 2022-08-06 DIAGNOSIS — R2689 Other abnormalities of gait and mobility: Secondary | ICD-10-CM

## 2022-08-06 DIAGNOSIS — M542 Cervicalgia: Secondary | ICD-10-CM

## 2022-08-06 DIAGNOSIS — R29898 Other symptoms and signs involving the musculoskeletal system: Secondary | ICD-10-CM | POA: Diagnosis not present

## 2022-08-06 DIAGNOSIS — R293 Abnormal posture: Secondary | ICD-10-CM

## 2022-08-06 DIAGNOSIS — M6281 Muscle weakness (generalized): Secondary | ICD-10-CM

## 2022-08-06 NOTE — Therapy (Signed)
OUTPATIENT PHYSICAL THERAPY TREATMENT   Patient Name: Earl Campbell MRN: [id number] DOB:May 29, 1961, 61 y.o., male Today's Date: 08/06/2022   PT End of Session - 08/06/22 0801     Visit Number 2    Number of Visits 12    Date for PT Re-Evaluation 09/26/22    Authorization Type [employer]et[employer]    Authorization Time Period 05/07/22-05/07/23    Authorization - Visit Number 2    Authorization - Number of Visits 20   per fiscal year   Progress Note Due on Visit 10    PT Start Time 0801    PT Stop Time 0845    PT Time Calculation (min) 44 min    Activity Tolerance Patient tolerated treatment well    Behavior During Therapy [facility] for tasks assessed/performed             Past Medical History:  Diagnosis Date   CAD S/P percutaneous coronary angioplasty 07/2007    abnormal nuclear ST --> Cath --> 80%mLAD - PCI with Promus DES 3.0 mm x 23 mm (~3.2 mm).[place], [place] stent with ~30% RCA on relook cath 04/2008   COPD (chronic obstructive pulmonary disease) ([place])    Hyperlipidemia    Hypertension    Past Surgical History:  Procedure Laterality Date   CARDIAC CATHETERIZATION  07/30/2007   (Dr. Monica Martinez --[facility] [place], [place]): abnormal Nuclear ST ordered to evaluate chest pain- 80% mLAD just after the major diagonal--> PCI; small ramus intermedius noted.  That the circumflex are relatively normal.  Dominant RCA relatively normal.  EF 70%.   CARDIAC CATHETERIZATION  04/2008   [place], [place]; Patent mLAD stent, 30%.  EF at least 55%.   CORONARY STENT PLACEMENT  07/30/2007   PCI - mLAD - Promus DES 3.0 mm x 23 mm (3.2 mm).   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 Pt notes that pain for the most part is constant.    PERTINENT HISTORY:  CAD s/p stent, HTN, anxiety, HEP C, ETOH; recent fall landing on face From eval: Fall 10 weeks ago hitting his head resulting in immediate pain/weakness following day  PAIN:  Are you having pain: 9/10 Location: upper/middle neck How would you describe your pain? "Just hurts" Best in past week: unclear Worst in past week: 9/10 Aggravating factors: carrying, bending, lifting, sitting, using UEs Easing factors: soft collar, holding head up, drinking beer/medication  PRECAUTIONS: none (has been using soft collar for comfort)  WEIGHT BEARING RESTRICTIONS: No  FALLS:  Has patient fallen in last 6 months? 1 fall, precipitating episode. Notes some "clumsiness" with navigating body shop and stairs but denies any overt changes since fall  LIVING ENVIRONMENT: Lives alone - just moved to one level apartment. A couple steps to enter no rails, pt states it is difficult to enter  OCCUPATION: works in [employer], has to be very active for jobs   PLOF: Independent  PATIENT GOALS: wants neck  back to normal  OBJECTIVE:   DIAGNOSTIC FINDINGS:  06/28/22 Cervical XR:  "IMPRESSION: No definite recent fracture is seen. There is reversal of lordosis limiting evaluation. There is minimal anterolisthesis at C3-C4 and C4-C5 levels. Degenerative changes are noted with bony spurs and facet hypertrophy."  POSTURE: significant forward head, only able to achieve neutral passively. Elevated UT B, L more than R. Increased thoracic kyphosis  PALPATION: No TTP but significant palpable tension B UT, LS, and paracervical musculature. No bony tenderness or deformity. Pt reports significant relief with STM   CERVICAL ROM:   Active ROM A/PROM (deg) eval  Flexion Able to bring chin to chest  Extension No active cervical extension, see comments  Right lateral flexion ~25%  Left lateral flexion ~10%  Right rotation 18  Left rotation 20   (Blank rows = not tested) Comments: mild pain with B rotation, about equal; for cervical extension - no movement against gravity although compensations noted at T spine. Passively able to achieve neutral in seated position, noted ability to extend past neutral in gravity eliminated position Rotation/sidebending measurements taken in pt resting position (notably forward head)  UPPER EXTREMITY ROM:  Active ROM Right eval Left eval  Shoulder flexion [facility]  Shoulder abduction [facility]  Shoulder internal rotation    Shoulder external rotation    Elbow flexion [facility] WFL  Elbow extension [facility] WFL  Wrist flexion [facility] WFL  Wrist extension WFL WFL   (Blank rows = not tested) Comments:elevation WNL B and painless  UPPER EXTREMITY MMT:  MMT Right eval Left eval  Shoulder flexion 5 4+  Shoulder extension    Shoulder abduction 5 5  Shoulder extension    Shoulder internal rotation    Shoulder external rotation    Elbow flexion 5 5  Elbow extension 4 4  Grip strength 52# 64#   (Blank rows = not tested)  Comments: Pt left hand dominant at  baseline  FUNCTIONAL TESTS:  Grip strength as above Gait: No AD, completely independent within clinic. Widened BOS, reduced truncal ROM and arm swing. Minimal hip extension B. Partial step through pattern. No overt  incoordination/ataxia   TODAY'S TREATMENT:  [facility] Adult PT Treatment:  DATE: 08/06/22 Therapeutic Exercise: UBE L4, 2 min fwd/2 min bwd Prone "I", "W", "Y" 2x10 each Neck ext AAROM 2x10 Seated Cervical retraction 2x10 Thoracic ext self mobilizations against chair Manual Therapy: STM & TPR UTs, LS, scalenes, SCM Cervical and upper thoracic mobs for extension and side gliding Modalities: TENS x 10 min, to pt tolerance, bilat cervico thoracic paraspinals    [facility] Adult PT Treatment:                                                DATE: 08/01/22 Therapeutic Exercise: Seated scap retraction x10 - cues for reduced compensations, encouraged performing in soft collar at home for improved head posture, education on HEP Supine cervical flex/ext with pillow prop x20, cues/education for appropriate support, positioning, HEP performance Manual Therapy: Brief STM to UT/LS, seated, pt reports notable relief   PATIENT EDUCATION:  Education details: Pt education on PT impairments, prognosis, and POC. Informed consent. Rationale for interventions, safe/appropriate HEP performance Person educated: Patient Education method: Explanation, Demonstration, Tactile cues, Verbal cues, and Handouts Education comprehension: verbalized understanding, returned demonstration, verbal cues required, tactile cues required, and needs further education   HOME EXERCISE PROGRAM: Access Code: [email or web address] URL: [email or web address] Date: 08/06/2022 Prepared by: Estill Bamberg April Thurnell Garbe  Exercises - Seated Scapular Retraction  - 1 x daily - 7 x weekly - 3 sets - 10 reps - Supine Cervical Flexion Extension on Pillow  - 1 x daily - 7 x weekly - 3  sets - 10 reps - Seated Thoracic Lumbar Extension with Pectoralis Stretch  - 1 x daily - 7 x weekly - 1 sets - 10 reps - Seated Cervical Retraction  - 1 x daily - 7 x weekly - 2 sets - 10 reps - Prone W Scapular Retraction  - 1 x daily - 7 x weekly - 2 sets - 10 reps  ASSESSMENT:  CLINICAL IMPRESSION: Treatment session focused primarily on reducing muscular tightness and continuing cervicothoracic strengthening. Performed trial of e-stim this session for pain. Consider trial of e-stim next session for NMED. Manual work for pt's thoracic hypomobility. Able to perform AAROM cervical retraction.   OBJECTIVE IMPAIRMENTS: Abnormal gait, decreased activity tolerance, decreased mobility, difficulty walking, decreased ROM, decreased strength, hypomobility, increased muscle spasms, impaired flexibility, impaired UE functional use, improper body mechanics, postural dysfunction, and pain.   ACTIVITY LIMITATIONS: carrying, lifting, bending, sitting, standing, stairs, dressing, and reach over head  PARTICIPATION LIMITATIONS: driving, shopping, community activity, and occupation  PERSONAL FACTORS: Age, Profession, Time since onset of injury/illness/exacerbation, and 3+ comorbidities: HTN, anxiety, CAD s/p stent  are also affecting patient's functional outcome.   REHAB POTENTIAL: Fair given severity of deficits and comorbidities  CLINICAL DECISION MAKING: Evolving/moderate complexity  EVALUATION COMPLEXITY: Moderate   GOALS: Goals reviewed with patient? No given time constraints  SHORT TERM GOALS: Target date: 08/22/2022   Pt will demonstrate appropriate understanding and performance of initially prescribed HEP in order to facilitate improved independence with management of symptoms.  Baseline: HEP provided on eval Goal status: INITIAL   2. Pt will improve greater than MCID on FOTO in order to demonstrate improved perception of function due to symptoms.  Baseline: deferred on eval given time  constraints  Goal status: INITIAL   LONG TERM GOALS: Target date: 09/12/2022  Pt will improve greater than 2x MCID on FOTO in order to demonstrate  improved perception of function due to symptoms. Baseline: deferred on eval given time constraints Goal status: INITIAL  Pt will demonstrate ability to maintain grossly neutral sitting posture for >38min with less than 6/10 pain on NPS in order to indicate improved functional tolerance of postural musculature. Baseline: profound forward head posture, no active cervical extension against gravity, 9/10 pain NPS Goal status: INITIAL  Pt will demonstrate ability to actively extend cervical spine to neutral with <6/10 pain on NPS in order to improve environmental awareness/safety with activity.  Baseline: see ROM chart above Goal status: INITIAL   Pt will report/demonstrate ability to work overhead with B UE for >16min continuously with less than 2 point increase from baseline on NPS in order to demonstrate improved tolerance to work activities.  Baseline: Requires unilateral UE supporting head for any activities at eye level or above Goal status: INITIAL   PLAN:  PT FREQUENCY: 1-2x/week  PT DURATION: 6 weeks  PLANNED INTERVENTIONS: Therapeutic exercises, Therapeutic activity, Neuromuscular re-education, Balance training, Gait training, Patient/Family education, Self Care, Joint mobilization, Stair training, Dry Needling, Electrical stimulation, Spinal mobilization, Cryotherapy, Moist heat, Taping, Manual therapy, and Re-evaluation  PLAN FOR NEXT SESSION: Progress ROM/strengthening exercises as able/appropriate, review HEP. Likely emphasis initially on gravity reduced/eliminated activation of cervical extensors and postural musculature  Usman Millett April Ma L [place], PT, DPT 08/06/2022 8:01 AM

## 2022-08-12 ENCOUNTER — Ambulatory Visit
Payer: No Typology Code available for payment source | Attending: Neurosurgery | Admitting: Rehabilitative and Restorative Service Providers"

## 2022-08-12 ENCOUNTER — Encounter: Payer: Self-pay | Admitting: Rehabilitative and Restorative Service Providers"

## 2022-08-12 DIAGNOSIS — M542 Cervicalgia: Secondary | ICD-10-CM | POA: Diagnosis not present

## 2022-08-12 DIAGNOSIS — M6281 Muscle weakness (generalized): Secondary | ICD-10-CM | POA: Insufficient documentation

## 2022-08-12 DIAGNOSIS — R2689 Other abnormalities of gait and mobility: Secondary | ICD-10-CM | POA: Diagnosis present

## 2022-08-12 DIAGNOSIS — R293 Abnormal posture: Secondary | ICD-10-CM | POA: Diagnosis present

## 2022-08-12 NOTE — Patient Instructions (Signed)

## 2022-08-12 NOTE — Therapy (Signed)
OUTPATIENT PHYSICAL THERAPY TREATMENT   Patient Name: Earl Campbell MRN: [id number] DOB:[birth year], 61 y.o., male Today's Date: 08/12/2022   PT End of Session - 08/12/22 1534     Visit Number 3    Number of Visits 12    Date for PT Re-Evaluation 09/26/22    Authorization Type [employer]    Authorization Time Period 05/07/22-05/07/23    Authorization - Visit Number 2    Authorization - Number of Visits 20    Progress Note Due on Visit 10    PT Start Time 1532    PT Stop Time 1620    PT Time Calculation (min) 48 min             Past Medical History:  Diagnosis Date   CAD S/P percutaneous coronary angioplasty 07/2007    abnormal nuclear ST --> Cath --> 80%mLAD - PCI with Promus DES 3.0 mm x 23 mm (~3.2 mm).Platinum, Weir stent with ~30% RCA on relook cath 04/2008   COPD (chronic obstructive pulmonary disease) ([place])    Hyperlipidemia    Hypertension    Past Surgical History:  Procedure Laterality Date   CARDIAC CATHETERIZATION  07/30/2007   (Dr. Monica Martinez --[facility] [place], [place]): abnormal Nuclear ST ordered to evaluate chest pain- 80% mLAD just after the major diagonal--> PCI; small ramus intermedius noted.  That the circumflex are relatively normal.  Dominant RCA relatively normal.  EF 70%.   CARDIAC CATHETERIZATION  04/2008   [place], [place]; Patent mLAD stent, 30%.  EF at least 55%.   CORONARY STENT PLACEMENT  07/30/2007   PCI - mLAD - Promus DES 3.0 mm x 23 mm (3.2 mm).   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 He just got off work and he "hurts all over" he has worked on some of the exercises he has been given. He is out of pain pills and the doctor does not want to give him anything except a "souped up aspirin" Pain in the neck is constant and worse today.    PERTINENT HISTORY:  CAD s/p stent, HTN, anxiety, HEP C, ETOH; recent fall landing on face From eval: Fall 10 weeks ago hitting his head resulting in immediate pain/weakness following day  PAIN:  Are you having pain: 11/10 Location: upper/middle neck How would you describe your pain? "Just hurts" Best in past week: unclear Worst in past week: 11/10 Aggravating factors: carrying, bending, lifting, sitting, using UEs Easing factors: soft collar, holding head up, drinking beer/medication  PRECAUTIONS: none (has been using soft collar for comfort)  WEIGHT BEARING RESTRICTIONS: No  FALLS:  Has patient fallen in last 6 months? 1 fall, precipitating episode. Notes some "clumsiness" with navigating body shop and stairs but denies any overt changes since fall  LIVING ENVIRONMENT: Lives alone - just moved to one level apartment. A couple steps to enter no rails, pt states it is difficult to enter  OCCUPATION: works in [employer],  has to be very active for jobs   PLOF: Independent  PATIENT GOALS: wants neck back to normal  OBJECTIVE:   DIAGNOSTIC FINDINGS:  06/28/22 Cervical XR:  "IMPRESSION: No definite recent fracture is seen. There is reversal of lordosis limiting evaluation. There is minimal anterolisthesis at C3-C4 and C4-C5 levels. Degenerative changes are noted with bony spurs and facet hypertrophy."  POSTURE: significant forward head, only able to achieve neutral passively. Elevated UT B, L more than R. Increased thoracic kyphosis  PALPATION: No TTP but significant palpable tension B UT, LS, and paracervical musculature. No bony tenderness or deformity. Pt reports significant relief with STM   CERVICAL ROM:   Active ROM A/PROM (deg) eval  Flexion Able to bring chin to chest  Extension No active cervical extension, see comments  Right lateral flexion ~25%  Left lateral flexion ~10%  Right rotation 18  Left rotation 20   (Blank rows = not tested) Comments: mild pain with B rotation, about equal; for cervical extension - no movement against gravity although compensations noted at T spine. Passively able to achieve neutral in seated position, noted ability to extend past neutral in gravity eliminated position Rotation/sidebending measurements taken in pt resting position (notably forward head)  UPPER EXTREMITY ROM:  Active ROM Right eval Left eval  Shoulder flexion [facility]  Shoulder abduction [facility]  Shoulder internal rotation    Shoulder external rotation    Elbow flexion [facility] WFL  Elbow extension [facility] WFL  Wrist flexion [facility] WFL  Wrist extension WFL WFL   (Blank rows = not tested) Comments:elevation WNL B and painless  UPPER EXTREMITY MMT:  MMT Right eval Left eval  Shoulder flexion 5 4+  Shoulder extension    Shoulder abduction 5 5  Shoulder extension    Shoulder internal rotation    Shoulder external rotation    Elbow flexion 5 5  Elbow extension 4 4  Grip strength 52# 64#    (Blank rows = not tested)  Comments: Pt left hand dominant at baseline  FUNCTIONAL TESTS:  Grip strength as above Gait: No AD, completely independent within clinic. Widened BOS, reduced truncal ROM and arm swing. Minimal hip extension B. Partial  step through pattern. No overt  incoordination/ataxia   TODAY'S TREATMENT:  [facility] Adult PT Treatment:                                                DATE:  08/12/22   Therapeutic Exercise: UBE L4, 1 min fwd/1/2 min bwd(pt stopped due to pain)  Supine  Axial extension 10 sec x 10 Nodding yes x 10  Shaking head no x 10  Scap squeeze 10 sec x 10 reps  Supine T UE s at ~ 80 deg abd x 2 min x 2 reps   Seated Cervical retraction 2x10 Thoracic ext self mobilizations against chair  Manual Therapy: Patient supine hooklying STM & TPR anterior and anterior lateral cervical musculature into scaleni, SCM Cervical traction 20-30 sec pull x 5 during treatment Cervical and upper thoracic mobs for extension and side gliding Modalities: Pt declined TENS   08/06/22 Therapeutic Exercise: UBE L4, 2 min fwd/2 min bwd Prone "I", "W", "Y" 2x10 each Neck ext AAROM 2x10 Seated Cervical retraction 2x10 Thoracic ext self mobilizations against chair Manual Therapy: STM & TPR UTs, LS, scalenes, SCM Cervical and upper thoracic mobs for extension and side gliding Modalities: TENS x 10 min, to pt tolerance, bilat cervico thoracic paraspinals    [facility] Adult PT Treatment:                                                DATE: 08/01/22 Therapeutic Exercise: Seated scap retraction x10 - cues for reduced compensations, encouraged performing in soft collar at home for improved head posture, education on HEP Supine cervical flex/ext with pillow prop x20, cues/education for appropriate support, positioning, HEP performance Manual Therapy: Brief STM to UT/LS, seated, pt reports notable relief   PATIENT EDUCATION:  Education details: Pt education on PT  impairments, prognosis, and POC. Informed consent. Rationale for interventions, safe/appropriate HEP performance Person educated: Patient Education method: Explanation, Demonstration, Tactile cues, Verbal cues, and Handouts Education comprehension: verbalized understanding, returned demonstration, verbal cues required, tactile cues required, and needs further education   HOME EXERCISE PROGRAM:  Access Code: [email or web address] URL: [email or web address] Date: 08/12/2022 Prepared by: Gillermo Murdoch  Exercises - Seated Scapular Retraction  - 1 x daily - 7 x weekly - 3 sets - 10 reps - Supine Cervical Flexion Extension on Pillow  - 1 x daily - 7 x weekly - 3 sets - 10 reps - Seated Thoracic Lumbar Extension with Pectoralis Stretch  - 1 x daily - 7 x weekly - 1 sets - 10 reps - Seated Cervical Retraction  - 1 x daily - 7 x weekly - 2 sets - 10 reps - Prone W Scapular Retraction  - 1 x daily - 7 x weekly - 2 sets - 10 reps - Supine Cervical Retraction with Towel  - 2 x daily - 7 x weekly - 1 sets - 5-10 reps - 10 sec  hold - Supine Scapular Retraction  - 2 x daily - 7 x weekly - 1 sets - 10 reps - 5-10 sec  hold - Supine Chest Stretch on Foam Roll  - 2 x daily - 7 x weekly - 1 sets - 1 reps - 2-5 min  sec  hold  ASSESSMENT:  CLINICAL IMPRESSION: Persistent pain related to head forward posture and use of UE's in forward head posture. Working on manual work and myofacial release anterior/lateral/posterior cervical musculature into pecs and clavicular area bilat to decrease muscular tightness and improve cervicothoracic muscular balance. Added axial extension and scap squeeze in supine and discussed positions for rest and sleep. Patient sits in a recliner and sleeps on his stomach and sides. He declined e-stim; will consider trial of dry needling.   OBJECTIVE IMPAIRMENTS: Abnormal gait, decreased activity tolerance, decreased mobility, difficulty walking, decreased ROM, decreased strength,  hypomobility, increased muscle spasms, impaired flexibility, impaired UE functional use, improper body mechanics, postural dysfunction, and pain.   ACTIVITY LIMITATIONS: carrying, lifting, bending, sitting, standing, stairs, dressing, and reach over head  PARTICIPATION LIMITATIONS: driving, shopping, community activity, and occupation  PERSONAL FACTORS: Age, Profession, Time since onset of injury/illness/exacerbation, and 3+ comorbidities: HTN, anxiety, CAD s/p stent  are also affecting patient's functional outcome.   REHAB POTENTIAL: Fair given severity of deficits and comorbidities  CLINICAL DECISION MAKING: Evolving/moderate complexity  EVALUATION COMPLEXITY: Moderate   GOALS: Goals reviewed with patient? No given time constraints  SHORT TERM GOALS: Target date: 08/22/2022   Pt will demonstrate appropriate understanding and performance of initially prescribed HEP in order to facilitate improved independence with management of symptoms.  Baseline: HEP provided on eval Goal status: INITIAL   2. Pt will improve greater than MCID on FOTO in order to demonstrate improved perception of function due to symptoms.  Baseline: deferred on eval given time constraints  Goal status: INITIAL   LONG TERM GOALS: Target date: 09/12/2022  Pt will improve greater than 2x MCID on FOTO in order to demonstrate improved perception of function due to symptoms. Baseline: deferred on eval given time constraints Goal status: INITIAL  Pt will demonstrate ability to maintain grossly neutral sitting posture for >54min with less than 6/10 pain on NPS in order to indicate improved functional tolerance of postural musculature. Baseline: profound forward head posture, no active cervical extension against gravity, 9/10 pain NPS Goal status: INITIAL  Pt will demonstrate ability to actively extend cervical spine to neutral with <6/10 pain on NPS in order to improve environmental awareness/safety with activity.   Baseline: see ROM chart above Goal status: INITIAL   Pt will report/demonstrate ability to work overhead with B UE for >26min continuously with less than 2 point increase from baseline on NPS in order to demonstrate improved tolerance to work activities.  Baseline: Requires unilateral UE supporting head for any activities at eye level or above Goal status: INITIAL   PLAN:  PT FREQUENCY: 1-2x/week  PT DURATION: 6 weeks  PLANNED INTERVENTIONS: Therapeutic exercises, Therapeutic activity, Neuromuscular re-education, Balance training, Gait training, Patient/Family education, Self Care, Joint mobilization, Stair training, Dry Needling, Electrical stimulation, Spinal mobilization, Cryotherapy, Moist heat, Taping, Manual therapy, and Re-evaluation  PLAN FOR NEXT SESSION: Progress ROM/strengthening exercises as able/appropriate, review HEP. Likely emphasis initially on gravity reduced/eliminated activation of cervical extensors and postural musculature  Tambra Muller Nilda Simmer, PT, MPH 08/12/2022 3:35 PM

## 2022-08-20 ENCOUNTER — Ambulatory Visit: Payer: No Typology Code available for payment source | Admitting: Physical Therapy

## 2022-08-20 ENCOUNTER — Encounter: Payer: Self-pay | Admitting: Physical Therapy

## 2022-08-20 DIAGNOSIS — R293 Abnormal posture: Secondary | ICD-10-CM

## 2022-08-20 DIAGNOSIS — M542 Cervicalgia: Secondary | ICD-10-CM

## 2022-08-20 DIAGNOSIS — M6281 Muscle weakness (generalized): Secondary | ICD-10-CM

## 2022-08-20 DIAGNOSIS — R2689 Other abnormalities of gait and mobility: Secondary | ICD-10-CM

## 2022-08-20 NOTE — Therapy (Signed)
OUTPATIENT PHYSICAL THERAPY TREATMENT   Patient Name: Marcelis Wissner MRN: [id number] DOB:Apr 11, 1961, 61 y.o., male Today's Date: 08/20/2022   PT End of Session - 08/20/22 0759     Visit Number 4    Number of Visits 12    Date for PT Re-Evaluation 09/26/22    Authorization Type [employer]    Authorization Time Period 05/07/22-05/07/23    Authorization - Visit Number 3    Authorization - Number of Visits 20    Progress Note Due on Visit 10    PT Start Time 0800    PT Stop Time 0840    PT Time Calculation (min) 40 min    Activity Tolerance Patient tolerated treatment well    Behavior During Therapy [facility] for tasks assessed/performed             Past Medical History:  Diagnosis Date   CAD S/P percutaneous coronary angioplasty 07/2007    abnormal nuclear ST --> Cath --> 80%mLAD - PCI with Promus DES 3.0 mm x 23 mm (~3.2 mm).[place], [place].  - Patent stent with ~30% RCA on relook cath 04/2008   COPD (chronic obstructive pulmonary disease) (HCC)    Hyperlipidemia    Hypertension    Past Surgical History:  Procedure Laterality Date   CARDIAC CATHETERIZATION  07/30/2007   (Dr. Marquette Saa --[facility] [place], [place]): abnormal Nuclear ST ordered to evaluate chest pain- 80% mLAD just after the major diagonal--> PCI; small ramus intermedius noted.  That the circumflex are relatively normal.  Dominant RCA relatively normal.  EF 70%.   CARDIAC CATHETERIZATION  04/2008   [place], [place]; Patent mLAD stent, 30%.  EF at least 55%.   CORONARY STENT PLACEMENT  07/30/2007   PCI - mLAD - Promus DES 3.0 mm x 23 mm (3.2 mm).   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 Pt reports he is able to drive without having to use his hands to keep his head up.    PERTINENT HISTORY:  CAD s/p stent, HTN, anxiety, HEP C, ETOH; recent fall landing on face From eval: Fall 10 weeks ago hitting his head resulting in immediate pain/weakness following day  PAIN:  Are you having pain: 5/10 Location: upper/middle neck How would you describe your pain? "Just hurts" Best in past week: unclear Worst in past week: 11/10 Aggravating factors: carrying, bending, lifting, sitting, using UEs Easing factors: soft collar, holding head up, drinking beer/medication  PRECAUTIONS: none (has been using soft collar for comfort)  WEIGHT BEARING RESTRICTIONS: No  FALLS:  Has patient fallen in last 6 months? 1 fall, precipitating episode. Notes some "clumsiness" with navigating body shop and stairs but denies any overt changes since fall  LIVING ENVIRONMENT: Lives alone - just moved to one level apartment. 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 There is reversal of lordosis limiting evaluation. There is minimal anterolisthesis at C3-C4 and C4-C5 levels. Degenerative changes are noted with bony spurs and facet hypertrophy."  POSTURE: significant forward head, only able to achieve neutral passively. Elevated UT B, L more than R. Increased thoracic kyphosis  PALPATION: No TTP but significant palpable tension B UT, LS, and paracervical musculature. No bony tenderness or deformity. Pt reports significant relief with STM   CERVICAL ROM:   Active ROM A/PROM (deg) eval AROM 11/14  Flexion Able to bring chin to chest   Extension No active cervical extension, see comments   Right lateral flexion ~25%   Left lateral flexion ~10%   Right rotation 18 25  Left rotation 20 30   (Blank rows = not tested) Comments: mild pain with B rotation, about equal; for cervical extension - no movement against gravity although compensations noted at T spine. Passively able to achieve neutral in seated position, noted ability to extend past neutral in gravity eliminated position Rotation/sidebending measurements taken in pt resting position (notably forward head)  UPPER EXTREMITY ROM:  Active ROM Right eval Left eval  Shoulder flexion [facility]  Shoulder abduction [facility]  Shoulder internal rotation    Shoulder external rotation    Elbow flexion [facility] WFL  Elbow extension [facility] WFL  Wrist flexion [facility] W[facility]  Wrist extension WFL WFL   (Blank rows = not tested) Comments:elevation WNL B and painless  UPPER EXTREMITY MMT:  MMT Right eval Left eval  Shoulder flexion 5 4+  Shoulder extension    Shoulder abduction 5 5  Shoulder extension    Shoulder internal rotation    Shoulder external rotation    Elbow flexion 5 5  Elbow extension 4 4  Grip strength 52# 64#   (Blank  rows = not tested)  Comments: Pt left hand dominant at baseline  FUNCTIONAL TESTS:  Grip strength as above Gait: No AD, completely independent within clinic. Widened BOS, reduced truncal ROM and arm swing. Minimal hip extension B. Partial step through pattern. No overt  incoordination/ataxia   [facility] Adult PT Treatment:  DATE: 08/20/22   Therapeutic Exercise: Prone "I" "W" "T" 2x10 each Supine  Axial extension 10 sec 2x 10 Neck retraction 2x 10  Neck rotation with MWM x10 L&R Neck rotation iso 5x5 sec Scap squeeze 2x 10 reps  Shoulder abd 2x10 green TB  Seated Cervical retraction x10 Cervical ext x10 Cervical retraction + rotation x10 Thoracic ext self mobilizations against chair  Manual Therapy: Cervical and upper thoracic mobs for extension and side gliding STM & TPR anterior neck, UTs, pecs Cervical PROM all directions   [facility] Adult PT Treatment:                                                DATE: 08/12/22   Therapeutic Exercise: UBE L4, 1 min fwd/1/2 min bwd(pt stopped due to pain)  Supine  Axial extension 10 sec x 10 Nodding yes x 10  Shaking head no x 10  Scap squeeze 10 sec x 10 reps  Supine T UE s at ~ 80 deg abd x 2 min x 2 reps   Seated Cervical retraction 2x10 Thoracic ext self mobilizations against chair  Manual Therapy: Patient supine hooklying STM & TPR anterior and anterior lateral cervical musculature into scaleni, SCM Cervical traction 20-30 sec pull x 5 during treatment Cervical and upper thoracic mobs for extension and side gliding Modalities: Pt declined TENS   08/06/22 Therapeutic Exercise: UBE L4, 2 min fwd/2 min bwd Prone "I", "W", "Y" 2x10 each Neck ext AAROM 2x10 Seated Cervical retraction 2x10 Thoracic ext self mobilizations against chair Manual Therapy: STM & TPR UTs, LS, scalenes, SCM Cervical and upper thoracic mobs for extension and side gliding Modalities: TENS x 10 min, to pt  tolerance, bilat cervico thoracic paraspinals      PATIENT EDUCATION:  Education details: Pt education on PT impairments, prognosis, and POC. Informed consent. Rationale for interventions, safe/appropriate HEP performance Person educated: Patient Education method: Explanation, Demonstration, Tactile cues, Verbal cues, and Handouts Education comprehension: verbalized understanding, returned demonstration, verbal cues required, tactile cues required, and needs further education   HOME EXERCISE PROGRAM:  Access Code: [email or web address] URL: [email or web address] Date: 08/12/2022 Prepared by: Corlis Leak  Exercises - Seated Scapular Retraction  - 1 x daily - 7 x weekly - 3 sets - 10 reps - Supine Cervical Flexion Extension on Pillow  - 1 x daily - 7 x weekly - 3 sets - 10 reps - Seated Thoracic Lumbar Extension with Pectoralis Stretch  - 1 x daily - 7 x weekly - 1 sets - 10 reps - Seated Cervical Retraction  - 1 x daily - 7 x weekly - 2 sets - 10 reps - Prone W Scapular Retraction  - 1 x daily - 7 x weekly - 2 sets - 10 reps - Supine Cervical Retraction with Towel  - 2 x daily - 7 x weekly - 1 sets - 5-10 reps - 10 sec  hold - Supine Scapular Retraction  - 2 x daily - 7 x weekly - 1 sets - 10 reps - 5-10 sec  hold - Supine Chest Stretch on Foam Roll  - 2 x daily - 7 x weekly - 1 sets - 1 reps - 2-5 min  sec  hold ASSESSMENT:  CLINICAL IMPRESSION: Continued manual work to address cervical and thoracic hypomobility and tightened cervical muscles from forward head postures. Pt with improving  cervical extension and deep neck flexor strength. Pt is demonstrating increasing ROM.   OBJECTIVE IMPAIRMENTS: Abnormal gait, decreased activity tolerance, decreased mobility, difficulty walking, decreased ROM, decreased strength, hypomobility, increased muscle spasms, impaired flexibility, impaired UE functional use, improper body mechanics, postural dysfunction, and pain.   ACTIVITY LIMITATIONS:  carrying, lifting, bending, sitting, standing, stairs, dressing, and reach over head  PARTICIPATION LIMITATIONS: driving, shopping, community activity, and occupation  PERSONAL FACTORS: Age, Profession, Time since onset of injury/illness/exacerbation, and 3+ comorbidities: HTN, anxiety, CAD s/p stent  are also affecting patient's functional outcome.   REHAB POTENTIAL: Fair given severity of deficits and comorbidities  CLINICAL DECISION MAKING: Evolving/moderate complexity  EVALUATION COMPLEXITY: Moderate   GOALS: Goals reviewed with patient? No given time constraints  SHORT TERM GOALS: Target date: 08/22/2022   Pt will demonstrate appropriate understanding and performance of initially prescribed HEP in order to facilitate improved independence with management of symptoms.  Baseline: HEP provided on eval Goal status: INITIAL   2. Pt will improve greater than MCID on FOTO in order to demonstrate improved perception of function due to symptoms.  Baseline: deferred on eval given time constraints  Goal status: INITIAL   LONG TERM GOALS: Target date: 09/12/2022  Pt will improve greater than 2x MCID on FOTO in order to demonstrate improved perception of function due to symptoms. Baseline: deferred on eval given time constraints Goal status: INITIAL  Pt will demonstrate ability to maintain grossly neutral sitting posture for >67min with less than 6/10 pain on NPS in order to indicate improved functional tolerance of postural musculature. Baseline: profound forward head posture, no active cervical extension against gravity, 9/10 pain NPS Goal status: INITIAL  Pt will demonstrate ability to actively extend cervical spine to neutral with <6/10 pain on NPS in order to improve environmental awareness/safety with activity.  Baseline: see ROM chart above Goal status: INITIAL   Pt will report/demonstrate ability to work overhead with B UE for >51min continuously with less than 2 point increase  from baseline on NPS in order to demonstrate improved tolerance to work activities.  Baseline: Requires unilateral UE supporting head for any activities at eye level or above Goal status: INITIAL   PLAN:  PT FREQUENCY: 1-2x/week  PT DURATION: 6 weeks  PLANNED INTERVENTIONS: Therapeutic exercises, Therapeutic activity, Neuromuscular re-education, Balance training, Gait training, Patient/Family education, Self Care, Joint mobilization, Stair training, Dry Needling, Electrical stimulation, Spinal mobilization, Cryotherapy, Moist heat, Taping, Manual therapy, and Re-evaluation  PLAN FOR NEXT SESSION: Progress ROM/strengthening exercises as able/appropriate, review HEP. Likely emphasis initially on gravity reduced/eliminated activation of cervical extensors and postural musculature  Aara Jacquot April Ma L Jericho, PT, DPT 08/20/2022 7:59 AM

## 2022-08-27 ENCOUNTER — Encounter: Payer: Self-pay | Admitting: Physical Therapy

## 2022-08-27 ENCOUNTER — Ambulatory Visit: Payer: No Typology Code available for payment source | Admitting: Physical Therapy

## 2022-08-27 DIAGNOSIS — M542 Cervicalgia: Secondary | ICD-10-CM

## 2022-08-27 DIAGNOSIS — R293 Abnormal posture: Secondary | ICD-10-CM

## 2022-08-27 DIAGNOSIS — M6281 Muscle weakness (generalized): Secondary | ICD-10-CM

## 2022-08-27 DIAGNOSIS — R2689 Other abnormalities of gait and mobility: Secondary | ICD-10-CM

## 2022-08-27 NOTE — Therapy (Signed)
OUTPATIENT PHYSICAL THERAPY TREATMENT   Patient Name: Earl Campbell MRN: [id number] DOB:1960/12/27, 61 y.o., male Today's Date: 08/27/2022   PT End of Session - 08/27/22 0804     Visit Number 5    Number of Visits 12    Date for PT Re-Evaluation 09/26/22    Authorization Type [employer]    Authorization Time Period 05/07/22-05/07/23    Authorization - Visit Number 5    Authorization - Number of Visits 20    Progress Note Due on Visit 10    PT Start Time 0804    PT Stop Time 0845    PT Time Calculation (min) 41 min    Activity Tolerance Patient tolerated treatment well    Behavior During Therapy [facility] for tasks assessed/performed             Past Medical History:  Diagnosis Date   CAD S/P percutaneous coronary angioplasty 07/2007    abnormal nuclear ST --> Cath --> 80%mLAD - PCI with Promus DES 3.0 mm x 23 mm (~3.2 mm).[place], [place].  - Patent stent with ~30% RCA on relook cath 04/2008   COPD (chronic obstructive pulmonary disease) (HCC)    Hyperlipidemia    Hypertension    Past Surgical History:  Procedure Laterality Date   CARDIAC CATHETERIZATION  07/30/2007   (Dr. Marquette Saa --[facility] [place], [place]): abnormal Nuclear ST ordered to evaluate chest pain- 80% mLAD just after the major diagonal--> PCI; small ramus intermedius noted.  That the circumflex are relatively normal.  Dominant RCA relatively normal.  EF 70%.   CARDIAC CATHETERIZATION  04/2008   Shirlee Latch [place], [place]; Patent mLAD stent, 30%.  EF at least 55%.   CORONARY STENT PLACEMENT  07/30/2007   PCI - mLAD - Promus DES 3.0 mm x 23 mm (3.2 mm).   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 Pt states he felt rough the whole weekend.    PERTINENT HISTORY:  CAD s/p stent, HTN, anxiety, HEP C, ETOH; recent fall landing on face From eval: Fall 10 weeks ago hitting his head resulting in immediate pain/weakness following day  PAIN:  Are you having pain: 9/10 Location: upper/middle neck How would you describe your pain? "Just hurts" Best in past week: unclear Worst in past week: 11/10 Aggravating factors: carrying, bending, lifting, sitting, using UEs Easing factors: soft collar, holding head up, drinking beer/medication  PRECAUTIONS: none (has been using soft collar for comfort)  WEIGHT BEARING RESTRICTIONS: No  FALLS:  Has patient fallen in last 6 months? 1 fall, precipitating episode. Notes some "clumsiness" with navigating body shop and stairs but denies any overt changes since fall  LIVING ENVIRONMENT: Lives alone - just moved to one level apartment. A couple steps to enter no rails, pt states it is difficult to enter  OCCUPATION: works in Systems analyst, has to be very active for jobs   PLOF: Independent  PATIENT GOALS: wants neck back to normal  OBJECTIVE:    DIAGNOSTIC FINDINGS:  06/28/22 Cervical XR:  "IMPRESSION: No definite recent fracture is seen. There is reversal of lordosis limiting evaluation. There is minimal anterolisthesis at C3-C4 and C4-C5 levels. Degenerative changes are noted with bony spurs and facet hypertrophy."  POSTURE: significant forward head, only able to achieve neutral passively. Elevated UT B, L more than R. Increased thoracic kyphosis  PALPATION: No TTP but significant palpable tension B UT, LS, and paracervical musculature. No bony tenderness or deformity. Pt reports significant relief with STM   CERVICAL ROM:   Active ROM A/PROM (deg) eval AROM 11/14  Flexion Able to bring chin to chest   Extension No active cervical extension, see comments   Right lateral flexion ~25%   Left lateral flexion ~10%   Right rotation 18 25  Left rotation 20 30   (Blank rows = not tested) Comments: mild pain with B rotation, about equal; for cervical extension - no movement against gravity although compensations noted at T spine. Passively able to achieve neutral in seated position, noted ability to extend past neutral in gravity eliminated position Rotation/sidebending measurements taken in pt resting position (notably forward head)  UPPER EXTREMITY ROM:  Active ROM Right eval Left eval  Shoulder flexion [facility]  Shoulder abduction [facility]  Shoulder internal rotation    Shoulder external rotation    Elbow flexion [facility] WFL  Elbow extension [facility] WFL  Wrist flexion [facility] WFL  Wrist extension WFL WFL   (Blank rows = not tested) Comments:elevation WNL B and painless  UPPER EXTREMITY MMT:  MMT Right eval Left eval  Shoulder flexion 5 4+  Shoulder extension    Shoulder abduction 5 5  Shoulder extension    Shoulder internal rotation    Shoulder external rotation    Elbow flexion 5 5  Elbow extension 4 4  Grip strength 52# 64#   (Blank rows = not tested)  Comments: Pt left hand dominant at baseline  FUNCTIONAL  TESTS:  Grip strength as above Gait: No AD, completely independent within clinic. Widened BOS, reduced truncal ROM and arm swing. Minimal hip extension B. Partial step through pattern. No overt  incoordination/ataxia   [facility] Adult PT Treatment:  DATE: 08/27/22   Therapeutic Exercise: Seated Thoracic ext self mobilizations against chair Ant scalene stretch x30 sec SCM stretch x30 sec Cervical retraction x10 Cervical ext x10 Shoulder ER green TB 2x10 "W" green TB 2x10 Shoulder horizontal abd green TB 2x10 Rows green TB 2x10 Standing Cervical retraction x10  Manual Therapy: Cervical and upper thoracic mobs for extension and rotation in seated (pt request not to lay on large plinth due to hardness of surface) STM & TPR cervical paraspinals, UTs Cervical ROM all directions MWM  Modalities: TENS unit to pt tolerance along R cervical paraspinals x10 min   [facility] Adult PT Treatment:                                                DATE: 08/20/22   Therapeutic Exercise: Prone "I" "W" "T" 2x10 each Supine  Axial extension 10 sec 2x 10 Neck retraction 2x 10  Neck rotation with MWM x10 L&R Neck rotation iso 5x5 sec Scap squeeze 2x 10 reps  Shoulder abd 2x10 green TB  Seated Cervical retraction x10 Cervical ext x10 Cervical retraction + rotation x10 Thoracic ext self mobilizations against chair  Manual Therapy: Cervical and upper thoracic mobs for extension and side gliding STM & TPR anterior neck, UTs, pecs Cervical PROM all directions   [facility] Adult PT Treatment:                                                DATE: 08/12/22   Therapeutic Exercise: UBE L4, 1 min fwd/1/2 min bwd(pt stopped due to pain)  Supine  Axial extension 10 sec x 10 Nodding yes x 10  Shaking head no x 10  Scap squeeze 10 sec x 10 reps  Supine T UE s at ~ 80 deg abd x 2 min x 2 reps   Seated Cervical retraction 2x10 Thoracic ext self mobilizations  against chair  Manual Therapy: Patient supine hooklying STM & TPR anterior and anterior lateral cervical musculature into scaleni, SCM Cervical traction 20-30 sec pull x 5 during treatment Cervical and upper thoracic mobs for extension and side gliding Modalities: Pt declined TENS       PATIENT EDUCATION:  Education details: Pt education on PT impairments, prognosis, and POC. Informed consent. Rationale for interventions, safe/appropriate HEP performance Person educated: Patient Education method: Explanation, Demonstration, Tactile cues, Verbal cues, and Handouts Education comprehension: verbalized understanding, returned demonstration, verbal cues required, tactile cues required, and needs further education   HOME EXERCISE PROGRAM:  Access Code: [email or web address] URL: [email or web address] Date: 08/12/2022 Prepared by: Corlis Leak  Exercises - Seated Scapular Retraction  - 1 x daily - 7 x weekly - 3 sets - 10 reps - Supine Cervical Flexion Extension on Pillow  - 1 x daily - 7 x weekly - 3 sets - 10 reps - Seated Thoracic Lumbar Extension with Pectoralis Stretch  - 1 x daily - 7 x weekly - 1 sets - 10 reps - Seated Cervical Retraction  - 1 x daily - 7 x weekly - 2 sets - 10 reps - Prone W Scapular Retraction  - 1 x daily - 7 x weekly - 2 sets - 10 reps - Supine Cervical Retraction with Towel  - 2 x  daily - 7 x weekly - 1 sets - 5-10 reps - 10 sec  hold - Supine Scapular Retraction  - 2 x daily - 7 x weekly - 1 sets - 10 reps - 5-10 sec  hold - Supine Chest Stretch on Foam Roll  - 2 x daily - 7 x weekly - 1 sets - 1 reps - 2-5 min  sec  hold  ASSESSMENT:  CLINICAL IMPRESSION: Increased R lower and upper cervicothoracic paraspinal pain this session. Continued to work on cervical and midback strengthening. Pt with difficulty tolerating laying on large plinth -- performed manual work and mobs in sitting this session with improved pt tolerance. Difficulty tolerating standing  exercises this session due to corns on his feet. Pt reports improved pain by end of session to 7/10. Discussed TPDN but deferred at this time.   OBJECTIVE IMPAIRMENTS: Abnormal gait, decreased activity tolerance, decreased mobility, difficulty walking, decreased ROM, decreased strength, hypomobility, increased muscle spasms, impaired flexibility, impaired UE functional use, improper body mechanics, postural dysfunction, and pain.   ACTIVITY LIMITATIONS: carrying, lifting, bending, sitting, standing, stairs, dressing, and reach over head  PARTICIPATION LIMITATIONS: driving, shopping, community activity, and occupation  PERSONAL FACTORS: Age, Profession, Time since onset of injury/illness/exacerbation, and 3+ comorbidities: HTN, anxiety, CAD s/p stent  are also affecting patient's functional outcome.   REHAB POTENTIAL: Fair given severity of deficits and comorbidities  CLINICAL DECISION MAKING: Evolving/moderate complexity  EVALUATION COMPLEXITY: Moderate   GOALS: Goals reviewed with patient? No given time constraints  SHORT TERM GOALS: Target date: 08/22/2022   Pt will demonstrate appropriate understanding and performance of initially prescribed HEP in order to facilitate improved independence with management of symptoms.  Baseline: HEP provided on eval Goal status: INITIAL   2. Pt will improve greater than MCID on FOTO in order to demonstrate improved perception of function due to symptoms.  Baseline: deferred on eval given time constraints  Goal status: INITIAL   LONG TERM GOALS: Target date: 09/12/2022  Pt will improve greater than 2x MCID on FOTO in order to demonstrate improved perception of function due to symptoms. Baseline: deferred on eval given time constraints Goal status: INITIAL  Pt will demonstrate ability to maintain grossly neutral sitting posture for >525min with less than 6/10 pain on NPS in order to indicate improved functional tolerance of postural  musculature. Baseline: profound forward head posture, no active cervical extension against gravity, 9/10 pain NPS Goal status: INITIAL  Pt will demonstrate ability to actively extend cervical spine to neutral with <6/10 pain on NPS in order to improve environmental awareness/safety with activity.  Baseline: see ROM chart above Goal status: INITIAL   Pt will report/demonstrate ability to work overhead with B UE for >312min continuously with less than 2 point increase from baseline on NPS in order to demonstrate improved tolerance to work activities.  Baseline: Requires unilateral UE supporting head for any activities at eye level or above Goal status: INITIAL   PLAN:  PT FREQUENCY: 1-2x/week  PT DURATION: 6 weeks  PLANNED INTERVENTIONS: Therapeutic exercises, Therapeutic activity, Neuromuscular re-education, Balance training, Gait training, Patient/Family education, Self Care, Joint mobilization, Stair training, Dry Needling, Electrical stimulation, Spinal mobilization, Cryotherapy, Moist heat, Taping, Manual therapy, and Re-evaluation  PLAN FOR NEXT SESSION: Progress ROM/strengthening exercises as able/appropriate, review HEP. Likely emphasis initially on gravity reduced/eliminated activation of cervical extensors and postural musculature  Jaymarie Yeakel April Ma L RocaNonato, PT, DPT 08/27/2022 8:05 AM

## 2022-09-03 ENCOUNTER — Encounter: Payer: Self-pay | Admitting: Physical Therapy

## 2022-09-03 ENCOUNTER — Ambulatory Visit: Payer: No Typology Code available for payment source | Admitting: Physical Therapy

## 2022-09-03 DIAGNOSIS — M6281 Muscle weakness (generalized): Secondary | ICD-10-CM

## 2022-09-03 DIAGNOSIS — M542 Cervicalgia: Secondary | ICD-10-CM

## 2022-09-03 DIAGNOSIS — R2689 Other abnormalities of gait and mobility: Secondary | ICD-10-CM

## 2022-09-03 DIAGNOSIS — R293 Abnormal posture: Secondary | ICD-10-CM

## 2022-09-03 NOTE — Therapy (Signed)
OUTPATIENT PHYSICAL THERAPY TREATMENT   Patient Name: Earl Campbell MRN: [id number] DOB:08-25-1961, 61 y.o., male Today's Date: 09/03/2022   PT End of Session - 09/03/22 0847     Visit Number 6    Number of Visits 12    Date for PT Re-Evaluation 09/26/22    Authorization Type [employer]    Authorization Time Period 05/07/22-05/07/23    Authorization - Number of Visits 20    Progress Note Due on Visit 10    PT Start Time 0847    PT Stop Time 0930    PT Time Calculation (min) 43 min    Activity Tolerance Patient tolerated treatment well    Behavior During Therapy [facility] for tasks assessed/performed             Past Medical History:  Diagnosis Date   CAD S/P percutaneous coronary angioplasty 07/2007    abnormal nuclear ST --> Cath --> 80%mLAD - PCI with Promus DES 3.0 mm x 23 mm (~3.2 mm).[place], [place] stent with ~30% RCA on relook cath 04/2008   COPD (chronic obstructive pulmonary disease) ([place])    Hyperlipidemia    Hypertension    Past Surgical History:  Procedure Laterality Date   CARDIAC CATHETERIZATION  07/30/2007   (Dr. Monica Martinez --[facility] [place], [place]): abnormal Nuclear ST ordered to evaluate chest pain- 80% mLAD just after the major diagonal--> PCI; small ramus intermedius noted.  That the circumflex are relatively normal.  Dominant RCA relatively normal.  EF 70%.   CARDIAC CATHETERIZATION  04/2008   [place], [place]; [place] mLAD stent, 30%.  EF at least 55%.   CORONARY STENT PLACEMENT  07/30/2007   PCI - mLAD - Promus DES 3.0 mm x 23 mm (3.2 mm).   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 A little bit better today.    PERTINENT HISTORY:  CAD s/p stent, HTN, anxiety, HEP C, ETOH; recent fall landing on face From eval: Fall 10 weeks ago hitting his head resulting in immediate pain/weakness following day  PAIN:  Are you having pain: 9/10 Location: upper/middle neck How would you describe your pain? "Just hurts" Best in past week: unclear Worst in past week: 11/10 Aggravating factors: carrying, bending, lifting, sitting, using UEs Easing factors: soft collar, holding head up, drinking beer/medication  PRECAUTIONS: none (has been using soft collar for comfort)  WEIGHT BEARING RESTRICTIONS: No  FALLS:  Has patient fallen in last 6 months? 1 fall, precipitating episode. Notes some "clumsiness" with navigating body shop and stairs but denies any overt changes since fall  LIVING ENVIRONMENT: Lives alone - just moved to one level apartment. A couple steps to enter no rails, pt states it is difficult to enter  OCCUPATION: works in [employer], has to be very active for jobs   PLOF: Independent  PATIENT GOALS: wants neck back to normal  OBJECTIVE:   DIAGNOSTIC FINDINGS:  06/28/22  Cervical XR:  "IMPRESSION: No definite recent fracture is seen. There is reversal of lordosis limiting evaluation. There is minimal anterolisthesis at C3-C4 and C4-C5 levels. Degenerative changes are noted with bony spurs and facet hypertrophy."  POSTURE: significant forward head, only able to achieve neutral passively. Elevated UT B, L more than R. Increased thoracic kyphosis  PALPATION: No TTP but significant palpable tension B UT, LS, and paracervical musculature. No bony tenderness or deformity. Pt reports significant relief with STM   CERVICAL ROM:   Active ROM A/PROM (deg) eval AROM 11/14  Flexion Able to bring chin to chest   Extension No active cervical extension, see comments   Right lateral flexion ~25%   Left lateral flexion ~10%   Right rotation 18 25  Left rotation 20 30   (Blank rows = not tested) Comments: mild pain with B rotation, about equal; for cervical extension - no movement against gravity although compensations noted at T spine. Passively able to achieve neutral in seated position, noted ability to extend past neutral in gravity eliminated position Rotation/sidebending measurements taken in pt resting position (notably forward head)  UPPER EXTREMITY ROM:  Active ROM Right eval Left eval  Shoulder flexion [facility]  Shoulder abduction [facility]  Shoulder internal rotation    Shoulder external rotation    Elbow flexion [facility] WFL  Elbow extension [facility] WFL  Wrist flexion [facility] WFL  Wrist extension WFL WFL   (Blank rows = not tested) Comments:elevation WNL B and painless  UPPER EXTREMITY MMT:  MMT Right eval Left eval  Shoulder flexion 5 4+  Shoulder extension    Shoulder abduction 5 5  Shoulder extension    Shoulder internal rotation    Shoulder external rotation    Elbow flexion 5 5  Elbow extension 4 4  Grip strength 52# 64#   (Blank rows = not tested)  Comments: Pt left hand dominant at baseline  FUNCTIONAL TESTS:  Grip strength as  above Gait: No AD, completely independent within clinic. Widened BOS, reduced truncal ROM and arm swing. Minimal hip extension B. Partial step through pattern. No overt  incoordination/ataxia  [facility] Adult PT Treatment:  DATE: 09/03/22 Therapeutic Exercise: Sitting Thoracic extension against chair 2x30 sec Cervical rotation with towel x10 R&L Cervical rotation x10 AROM Cervical side bending x10 AROM Cervical ext x10 AROM Chin tuck 2x10 Ant scalene stretch 2x30 sec Shoulder ER green TB x10, blue TB x10 maintaining chin tuck "W" blue 2x10 maintaining chin tuck Shoulder horizontal abd blue TB 2x10 maintaining chin tuck Standing Doorway pec stretch 3 ways 2x30 sec Manual Therapy: Cervical and upper thoracic mobs for extension and rotation in seated (pt request not to lay on large plinth due to hardness of surface) STM & TPR cervical paraspinals, UTs Cervical ROM all directions MWM   [facility] Adult PT Treatment:                                                DATE: 08/27/22   Therapeutic Exercise: Seated Thoracic ext self mobilizations against chair Ant scalene stretch x30 sec SCM stretch x30 sec Cervical retraction x10 Cervical ext x10 Shoulder ER green TB 2x10 "W" green TB 2x10 Shoulder horizontal abd green TB 2x10 Rows green TB 2x10 Standing Cervical retraction x10  Manual Therapy: Cervical and upper thoracic mobs for extension and rotation in seated (pt request not to lay on large plinth due to hardness of surface) STM & TPR cervical paraspinals, UTs Cervical ROM all directions MWM  Modalities: TENS unit to pt tolerance along R cervical paraspinals x10 min   [facility] Adult PT Treatment:                                                DATE: 08/20/22   Therapeutic Exercise: Prone "I" "W" "T" 2x10 each Supine  Axial extension 10 sec 2x 10 Neck retraction 2x 10  Neck rotation with MWM x10 L&R Neck rotation iso 5x5 sec Scap  squeeze 2x 10 reps  Shoulder abd 2x10 green TB  Seated Cervical retraction x10 Cervical ext x10 Cervical retraction + rotation x10 Thoracic ext self mobilizations against chair  Manual Therapy: Cervical and upper thoracic mobs for extension and side gliding STM & TPR anterior neck, UTs, pecs Cervical PROM all directions       PATIENT EDUCATION:  Education details: Pt education on PT impairments, prognosis, and POC. Informed consent. Rationale for interventions, safe/appropriate HEP performance Person educated: Patient Education method: Explanation, Demonstration, Tactile cues, Verbal cues, and Handouts Education comprehension: verbalized understanding, returned demonstration, verbal cues required, tactile cues required, and needs further education   HOME EXERCISE PROGRAM:  Access Code: [email or web address] URL: [email or web address] Date: 08/12/2022 Prepared by: Gillermo Murdoch  Exercises - Seated Scapular Retraction  - 1 x daily - 7 x weekly - 3 sets - 10 reps - Supine Cervical Flexion Extension on Pillow  - 1 x daily - 7 x weekly - 3 sets - 10 reps - Seated Thoracic Lumbar Extension with Pectoralis Stretch  - 1 x daily - 7 x weekly - 1 sets - 10 reps - Seated Cervical Retraction  - 1 x daily - 7 x weekly - 2 sets - 10 reps - Prone W Scapular Retraction  - 1 x daily - 7 x weekly - 2 sets - 10 reps - Supine Cervical Retraction with Towel  - 2 x daily - 7 x  weekly - 1 sets - 5-10 reps - 10 sec  hold - Supine Scapular Retraction  - 2 x daily - 7 x weekly - 1 sets - 10 reps - 5-10 sec  hold - Supine Chest Stretch on Foam Roll  - 2 x daily - 7 x weekly - 1 sets - 1 reps - 2-5 min  sec  hold  ASSESSMENT:  CLINICAL IMPRESSION: Reports R mid and upper cervical pain this session. Find that pt rests his head in slight R side flexion. Continued manual work to address muscle spasm and joint hypomobility. Continued midback and cervical strengthening/ROM. Able to progress to blue TB.  Continues to defer treatment in standing at this time.   OBJECTIVE IMPAIRMENTS: Abnormal gait, decreased activity tolerance, decreased mobility, difficulty walking, decreased ROM, decreased strength, hypomobility, increased muscle spasms, impaired flexibility, impaired UE functional use, improper body mechanics, postural dysfunction, and pain.   ACTIVITY LIMITATIONS: carrying, lifting, bending, sitting, standing, stairs, dressing, and reach over head  PARTICIPATION LIMITATIONS: driving, shopping, community activity, and occupation  PERSONAL FACTORS: Age, Profession, Time since onset of injury/illness/exacerbation, and 3+ comorbidities: HTN, anxiety, CAD s/p stent  are also affecting patient's functional outcome.   REHAB POTENTIAL: Fair given severity of deficits and comorbidities  CLINICAL DECISION MAKING: Evolving/moderate complexity  EVALUATION COMPLEXITY: Moderate   GOALS: Goals reviewed with patient? No   SHORT TERM GOALS: Target date: 08/22/2022   Pt will demonstrate appropriate understanding and performance of initially prescribed HEP in order to facilitate improved independence with management of symptoms.  Baseline: HEP provided on eval Goal status: MET  2. Pt will improve greater than MCID on FOTO in order to demonstrate improved perception of function due to symptoms.  Baseline: deferred on eval given time constraints  Goal status: DEFERRED  LONG TERM GOALS: Target date: 09/12/2022  Pt will improve greater than 2x MCID on FOTO in order to demonstrate improved perception of function due to symptoms. Baseline: deferred on eval given time constraints Goal status: INITIAL  Pt will demonstrate ability to maintain grossly neutral sitting posture for >69mn with less than 6/10 pain on NPS in order to indicate improved functional tolerance of postural musculature. Baseline: profound forward head posture, no active cervical extension against gravity, 9/10 pain NPS Goal status:  INITIAL  Pt will demonstrate ability to actively extend cervical spine to neutral with <6/10 pain on NPS in order to improve environmental awareness/safety with activity.  Baseline: see ROM chart above Goal status: INITIAL   Pt will report/demonstrate ability to work overhead with B UE for >261m continuously with less than 2 point increase from baseline on NPS in order to demonstrate improved tolerance to work activities.  Baseline: Requires unilateral UE supporting head for any activities at eye level or above Goal status: INITIAL   PLAN:  PT FREQUENCY: 1-2x/week  PT DURATION: 6 weeks  PLANNED INTERVENTIONS: Therapeutic exercises, Therapeutic activity, Neuromuscular re-education, Balance training, Gait training, Patient/Family education, Self Care, Joint mobilization, Stair training, Dry Needling, Electrical stimulation, Spinal mobilization, Cryotherapy, Moist heat, Taping, Manual therapy, and Re-evaluation  PLAN FOR NEXT SESSION: Progress ROM/strengthening exercises as able/appropriate, review HEP. Likely emphasis initially on gravity reduced/eliminated activation of cervical extensors and postural musculature  Andrew Soria April Ma L NoJohnson PrairiePT, DPT 09/03/2022 8:48 AM

## 2022-09-10 ENCOUNTER — Ambulatory Visit: Payer: No Typology Code available for payment source | Attending: Neurosurgery | Admitting: Physical Therapy

## 2022-09-10 DIAGNOSIS — R2689 Other abnormalities of gait and mobility: Secondary | ICD-10-CM | POA: Insufficient documentation

## 2022-09-10 DIAGNOSIS — M6281 Muscle weakness (generalized): Secondary | ICD-10-CM | POA: Insufficient documentation

## 2022-09-10 DIAGNOSIS — R293 Abnormal posture: Secondary | ICD-10-CM | POA: Insufficient documentation

## 2022-09-10 DIAGNOSIS — M542 Cervicalgia: Secondary | ICD-10-CM | POA: Diagnosis present

## 2022-09-10 NOTE — Therapy (Signed)
OUTPATIENT PHYSICAL THERAPY TREATMENT   Patient Name: Earl Campbell MRN: [id number] DOB:1961/02/09, 61 y.o., male Today's Date: 09/10/2022   PT End of Session - 09/10/22 1514     Visit Number 7    Number of Visits 12    Date for PT Re-Evaluation 09/26/22    Authorization Type [employer]    Authorization Time Period 05/07/22-05/07/23    Authorization - Number of Visits 20    Progress Note Due on Visit 10    PT Start Time 2174    PT Stop Time 1523    PT Time Calculation (min) 38 min    Activity Tolerance Patient tolerated treatment well    Behavior During Therapy [facility] for tasks assessed/performed             Past Medical History:  Diagnosis Date   CAD S/P percutaneous coronary angioplasty 07/2007    abnormal nuclear ST --> Cath --> 80%mLAD - PCI with Promus DES 3.0 mm x 23 mm (~3.2 mm).[place], [place] stent with ~30% RCA on relook cath 04/2008   COPD (chronic obstructive pulmonary disease) ([place])    Hyperlipidemia    Hypertension    Past Surgical History:  Procedure Laterality Date   CARDIAC CATHETERIZATION  07/30/2007   (Dr. Monica Martinez --[facility] [place], [place]): abnormal Nuclear ST ordered to evaluate chest pain- 80% mLAD just after the major diagonal--> PCI; small ramus intermedius noted.  That the circumflex are relatively normal.  Dominant RCA relatively normal.  EF 70%.   CARDIAC CATHETERIZATION  04/2008   [place], [place]; [place] mLAD stent, 30%.  EF at least 55%.   CORONARY STENT PLACEMENT  07/30/2007   PCI - mLAD - Promus DES 3.0 mm x 23 mm (3.2 mm).   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  Rationale for Evaluation and Treatment Rehabilitation  ONSET DATE: ~2 months ago from evaluation  SUBJECTIVE:  SUBJECTIVE STATEMENT: Pt states his neck started hurting quite a bit yesterday. A little bit better today.    PERTINENT HISTORY:  CAD s/p stent, HTN, anxiety, HEP C, ETOH; recent fall landing on face From eval: Fall 10 weeks ago hitting his head resulting in immediate pain/weakness following day  PAIN:  Are you having pain: 9/10 Location: upper/middle neck How would you describe your pain? "Just hurts" Best in past week: unclear Worst in past week: 11/10 Aggravating factors: carrying, bending, lifting, sitting, using UEs Easing factors: soft collar, holding head up, drinking beer/medication  PRECAUTIONS: none (has been using soft collar for comfort)  WEIGHT BEARING RESTRICTIONS: No  FALLS:  Has patient fallen in last 6 months? 1 fall, precipitating episode. Notes some "clumsiness" with navigating body shop and stairs but denies any overt changes since fall  LIVING ENVIRONMENT: Lives alone - just moved to one level apartment. A couple steps to enter no rails, pt states it is difficult to enter  OCCUPATION: works in [employer], has to be very active for jobs   PLOF: Independent  PATIENT GOALS: wants neck back to normal  OBJECTIVE:   DIAGNOSTIC FINDINGS:  06/28/22 Cervical XR:  "IMPRESSION: No definite recent fracture is seen. There is reversal of  lordosis limiting evaluation. There is minimal anterolisthesis at C3-C4 and C4-C5 levels. Degenerative changes are noted with bony spurs and facet hypertrophy."  POSTURE: significant forward head, only able to achieve neutral passively. Elevated UT B, L more than R. Increased thoracic kyphosis  PALPATION: No TTP but significant palpable tension B UT, LS, and paracervical musculature. No bony tenderness or deformity. Pt reports significant relief with STM   CERVICAL ROM:   Active ROM A/PROM (deg) eval AROM 11/14  Flexion Able to bring chin to chest   Extension No active cervical extension, see comments   Right lateral flexion ~25%   Left lateral flexion ~10%   Right rotation 18 25  Left rotation 20 30   (Blank rows = not tested) Comments: mild pain with B rotation, about equal; for cervical extension - no movement against gravity although compensations noted at T spine. Passively able to achieve neutral in seated position, noted ability to extend past neutral in gravity eliminated position Rotation/sidebending measurements taken in pt resting position (notably forward head)  UPPER EXTREMITY ROM:  Active ROM Right eval Left eval  Shoulder flexion [facility]  Shoulder abduction [facility]  Shoulder internal rotation    Shoulder external rotation    Elbow flexion [facility] WFL  Elbow extension [facility] WFL  Wrist flexion [facility] WFL  Wrist extension WFL WFL   (Blank rows = not tested) Comments:elevation WNL B and painless  UPPER EXTREMITY MMT:  MMT Right eval Left eval  Shoulder flexion 5 4+  Shoulder extension    Shoulder abduction 5 5  Shoulder extension    Shoulder internal rotation    Shoulder external rotation    Elbow flexion 5 5  Elbow extension 4 4  Grip strength 52# 64#   (Blank rows = not tested)  Comments: Pt left hand dominant at baseline  FUNCTIONAL TESTS:  Grip strength as above Gait: No AD, completely independent within clinic. Widened BOS, reduced truncal ROM and arm  swing. Minimal hip extension B. Partial step through pattern. No overt  incoordination/ataxia  [facility] Adult PT Treatment:  DATE: 09/10/22 Therapeutic Exercise: Sitting Thoracic extension against chair 2x30 sec Cervical rotation x10 AROM Cervical side bending 2x30 sec Cervical ext x10 AROM Standing Counter plank with cervical retraction 3x10 sec holds Leaning against wall cervical ext x 10 Leaning against wall chin tuck + horizontal abd green TB 2x10 Leaning against wall chin tuck + shoulder ext green TB 2x10 Leaning against wall chin tuck + rotation x 10 L&R Manual Therapy: STM & TPR cervical and upper thoracic paraspinals, UTs Cervical ROM all directions MWM Cervical and thoracic mobs for ext, rotation, lateral flexion K tape as follows with additional "I" strip along upper thoracic spine to facilitate cervical and thoracic paraspinals   [facility] Adult PT Treatment:                                                DATE: 09/03/22 Therapeutic Exercise: Sitting Thoracic extension against chair 2x30 sec Cervical rotation with towel x10 R&L Cervical rotation x10 AROM Cervical side bending x10 AROM Cervical ext x10 AROM Chin tuck 2x10 Ant scalene stretch 2x30 sec Shoulder ER green TB x10, blue TB x10 maintaining chin tuck "W" blue 2x10 maintaining chin tuck Shoulder horizontal abd blue TB 2x10 maintaining chin tuck Standing Doorway pec stretch 3 ways 2x30 sec Manual Therapy: Cervical and upper thoracic mobs for extension and rotation in seated (pt request not to lay on large plinth due to hardness of surface) STM & TPR cervical paraspinals, UTs Cervical ROM all directions MWM     PATIENT EDUCATION:  Education details: Pt education on PT impairments, prognosis, and POC. Informed consent. Rationale for interventions, safe/appropriate HEP performance Person educated: Patient Education method: Explanation, Demonstration, Tactile cues, Verbal  cues, and Handouts Education comprehension: verbalized understanding, returned demonstration, verbal cues required, tactile cues required, and needs further education   HOME EXERCISE PROGRAM:  Access Code: [email or web address] URL: [email or web address] Date: 08/12/2022 Prepared by: Gillermo Murdoch  Exercises - Seated Scapular Retraction  - 1 x daily - 7 x weekly - 3 sets - 10 reps - Supine Cervical Flexion Extension on Pillow  - 1 x daily - 7 x weekly - 3 sets - 10 reps - Seated Thoracic Lumbar Extension with Pectoralis Stretch  - 1 x daily - 7 x weekly - 1 sets - 10 reps - Seated Cervical Retraction  - 1 x daily - 7 x weekly - 2 sets - 10 reps - Prone W Scapular Retraction  - 1 x daily - 7 x weekly - 2 sets - 10 reps - Supine Cervical Retraction with Towel  - 2 x daily - 7 x weekly - 1 sets - 5-10 reps - 10 sec  hold - Supine Scapular Retraction  - 2 x daily - 7 x weekly - 1 sets - 10 reps - 5-10 sec  hold - Supine Chest Stretch on Foam Roll  - 2 x daily - 7 x weekly - 1 sets - 1 reps - 2-5 min  sec  hold  ASSESSMENT:  CLINICAL IMPRESSION: Increased tightness with R cervical rotation -- improved when performed with extension. Continued manual work to address joint hypomobility. Initiated more cervical strengthening against gravity and in standing. Currently compensates to increase cervical extension by performing hip and trunk extension. Performed trial of taping.   OBJECTIVE IMPAIRMENTS: Abnormal gait, decreased activity tolerance, decreased mobility, difficulty walking, decreased ROM, decreased strength, hypomobility,  increased muscle spasms, impaired flexibility, impaired UE functional use, improper body mechanics, postural dysfunction, and pain.   ACTIVITY LIMITATIONS: carrying, lifting, bending, sitting, standing, stairs, dressing, and reach over head  PARTICIPATION LIMITATIONS: driving, shopping, community activity, and occupation  PERSONAL FACTORS: Age, Profession, Time since  onset of injury/illness/exacerbation, and 3+ comorbidities: HTN, anxiety, CAD s/p stent  are also affecting patient's functional outcome.   REHAB POTENTIAL: Fair given severity of deficits and comorbidities  CLINICAL DECISION MAKING: Evolving/moderate complexity  EVALUATION COMPLEXITY: Moderate   GOALS: Goals reviewed with patient? No   SHORT TERM GOALS: Target date: 08/22/2022   Pt will demonstrate appropriate understanding and performance of initially prescribed HEP in order to facilitate improved independence with management of symptoms.  Baseline: HEP provided on eval Goal status: MET  2. Pt will improve greater than MCID on FOTO in order to demonstrate improved perception of function due to symptoms.  Baseline: deferred on eval given time constraints  Goal status: DEFERRED  LONG TERM GOALS: Target date: 09/12/2022  Pt will improve greater than 2x MCID on FOTO in order to demonstrate improved perception of function due to symptoms. Baseline: deferred on eval given time constraints Goal status: INITIAL  Pt will demonstrate ability to maintain grossly neutral sitting posture for >62mn with less than 6/10 pain on NPS in order to indicate improved functional tolerance of postural musculature. Baseline: profound forward head posture, no active cervical extension against gravity, 9/10 pain NPS Goal status: INITIAL  Pt will demonstrate ability to actively extend cervical spine to neutral with <6/10 pain on NPS in order to improve environmental awareness/safety with activity.  Baseline: see ROM chart above Goal status: INITIAL   Pt will report/demonstrate ability to work overhead with B UE for >23m continuously with less than 2 point increase from baseline on NPS in order to demonstrate improved tolerance to work activities.  Baseline: Requires unilateral UE supporting head for any activities at eye level or above Goal status: INITIAL   PLAN:  PT FREQUENCY: 1-2x/week  PT  DURATION: 6 weeks  PLANNED INTERVENTIONS: Therapeutic exercises, Therapeutic activity, Neuromuscular re-education, Balance training, Gait training, Patient/Family education, Self Care, Joint mobilization, Stair training, Dry Needling, Electrical stimulation, Spinal mobilization, Cryotherapy, Moist heat, Taping, Manual therapy, and Re-evaluation  PLAN FOR NEXT SESSION: Progress ROM/strengthening exercises as able/appropriate, review HEP. Progress to against gravity.   Jenniffer Vessels April Ma L NoGaylesvillePT, DPT 09/10/2022 3:24 PM

## 2022-09-17 ENCOUNTER — Ambulatory Visit: Payer: No Typology Code available for payment source | Admitting: Physical Therapy

## 2022-09-17 ENCOUNTER — Encounter: Payer: Self-pay | Admitting: Physical Therapy

## 2022-09-17 DIAGNOSIS — M542 Cervicalgia: Secondary | ICD-10-CM | POA: Diagnosis not present

## 2022-09-17 DIAGNOSIS — R2689 Other abnormalities of gait and mobility: Secondary | ICD-10-CM

## 2022-09-17 DIAGNOSIS — M6281 Muscle weakness (generalized): Secondary | ICD-10-CM

## 2022-09-17 DIAGNOSIS — R293 Abnormal posture: Secondary | ICD-10-CM

## 2022-09-17 NOTE — Therapy (Addendum)
OUTPATIENT PHYSICAL THERAPY TREATMENT AND RE-CERT   Patient Name: Earl Campbell MRN: [id number] DOB:[birth year], 61 y.o., male Today's Date: 09/17/2022   PT End of Session - 09/17/22 0803     Visit Number 8    Number of Visits 12    Date for PT Re-Evaluation 10/29/22    PT Start Time 0802    PT Stop Time 0845    PT Time Calculation (min) 43 min    Activity Tolerance Patient tolerated treatment well    Behavior During Therapy [facility] for tasks assessed/performed             Past Medical History:  Diagnosis Date   CAD S/P percutaneous coronary angioplasty 07/2007    abnormal nuclear ST --> Cath --> 80%mLAD - PCI with Promus DES 3.0 mm x 23 mm (~3.2 mm).[place], [place] stent with ~30% RCA on relook cath 04/2008   COPD (chronic obstructive pulmonary disease) ([place])    Hyperlipidemia    Hypertension    Past Surgical History:  Procedure Laterality Date   CARDIAC CATHETERIZATION  07/30/2007   (Dr. Monica Martinez --[facility] [place], [place]): abnormal Nuclear ST ordered to evaluate chest pain- 80% mLAD just after the major diagonal--> PCI; small ramus intermedius noted.  That the circumflex are relatively normal.  Dominant RCA relatively normal.  EF 70%.   CARDIAC CATHETERIZATION  04/2008   [place], [place]; Patent mLAD stent, 30%.  EF at least 55%.   CORONARY STENT PLACEMENT  07/30/2007   PCI - mLAD - Promus DES 3.0 mm x 23 mm (3.2 mm).   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 Patient states his has neck started feeling better over the weekend, however continues to have "steady" pain at base of head/neck.    PERTINENT HISTORY:  CAD s/p stent, HTN, anxiety, HEP C, ETOH; recent fall landing on face From eval: Fall 10 weeks ago hitting his head resulting in immediate pain/weakness following day  PAIN:  Are you having pain: 7/10 Location: upper/middle neck How would you describe your pain? "Just hurts" Best in past week: unclear Worst in past week: 11/10 Aggravating factors: carrying, bending, lifting, sitting, using UEs Easing factors: soft collar, holding head up, drinking beer/medication  PRECAUTIONS: none (has been using soft collar for comfort)  WEIGHT BEARING RESTRICTIONS: No  FALLS:  Has patient fallen in last 6 months? 1 fall, precipitating episode. Notes some "clumsiness" with navigating body shop and stairs but denies any overt changes since fall  LIVING ENVIRONMENT: Lives alone - just moved to one level apartment. A couple steps to enter no rails, pt states it is difficult to enter  OCCUPATION: works in [employer], has to be very active for jobs   PLOF: Independent  PATIENT GOALS: wants neck back to normal  OBJECTIVE:   From eval unless otherwise  noted  POSTURE: significant forward head, only able to achieve neutral passively. Elevated UT B, L more than R. Increased thoracic kyphosis  PALPATION: No TTP but significant palpable tension B UT, LS, and paracervical musculature. No bony tenderness or deformity. Pt reports significant relief with STM   CERVICAL ROM:   Active ROM A/PROM (deg) eval AROM 11/14 AROM 12/12  Flexion Able to bring chin to chest    Extension No active cervical extension, see comments    Right lateral flexion ~25%    Left lateral flexion ~10%    Right rotation 18 25 40  Left rotation 20 30 52   (Blank rows = not tested) Comments: mild pain with B rotation, about equal; for cervical extension - no movement against gravity although compensations noted at T spine. Passively able to achieve neutral in seated position, noted ability to extend past neutral in gravity eliminated position Rotation/sidebending measurements taken in pt resting position (notably forward head)  UPPER EXTREMITY ROM:  Active ROM Right eval Left eval  Shoulder flexion [facility]  Shoulder abduction [facility]  Shoulder internal rotation    Shoulder external rotation    Elbow flexion [facility] WFL  Elbow extension [facility] WFL  Wrist flexion [facility] WFL  Wrist extension WFL WFL   (Blank rows = not tested) Comments:elevation WNL B and painless  UPPER EXTREMITY MMT:  MMT Right eval Left eval  Shoulder flexion 5 4+  Shoulder extension    Shoulder abduction 5 5  Shoulder extension    Shoulder internal rotation    Shoulder external rotation    Elbow flexion 5 5  Elbow extension 4 4  Grip strength 52# 64#   (Blank rows = not tested)  Comments: Pt left hand dominant at baseline  FUNCTIONAL TESTS:  Grip strength as above Gait: No AD, completely independent within clinic. Widened BOS, reduced truncal ROM and arm swing. Minimal hip extension B. Partial step through pattern. No overt  incoordination/ataxia  [facility] Adult PT Treatment:  DATE: 09/17/22 Therapeutic Exercise: Sitting Pulleys: flex/scap x2 min each Thoracic extension against chair x30 sec; added rotation x30" B Cervical rotation x10 AROM Cervical side bending 2x30 sec Cervical ext x10 AROM Cervical rotation x10 AROM Standing Leaning against wall (noodle vert at back) with cervical retraction 3x10 sec hold Leaning against wall chin tuck + horizontal abd green TB 2x10 Leaning against wall chin tuck + shoulder ext green TB 2x10 2 min overhead task to test long-term goals Manual Therapy: Sitting: STM & TPR sub-occipitals; STM cervical paraspinals & UT Cervical ROM all directions MWM Cervical side gliding mobs; cervical rotation with lateral glide mobs   [facility] Adult PT Treatment:                                                DATE: 09/10/22 Therapeutic Exercise: Sitting Thoracic extension against chair 2x30 sec Cervical rotation x10 AROM Cervical side bending 2x30 sec Cervical ext x10 AROM Functional testing of sitting posture tolerance and pain  Functional cervical ext ROM testing Standing Counter plank with cervical retraction 3x10 sec holds Leaning against wall cervical ext x 10 Leaning against wall chin tuck + horizontal abd green TB 2x10 Leaning against wall chin tuck + shoulder ext green TB 2x10 Leaning against wall chin tuck + rotation x 10 L&R Manual Therapy: STM & TPR cervical and upper thoracic paraspinals, UTs Cervical ROM all directions MWM Cervical and thoracic mobs for ext, rotation, lateral flexion K tape as follows with additional "I" strip along upper thoracic spine to facilitate cervical and thoracic paraspinals    PATIENT EDUCATION:  Education details: Progressing POC to progress cervical ROM and strategies to decrease forward head posture Person educated: Patient Education method: Explanation, Demonstration, Tactile cues, Verbal cues, and Handouts Education comprehension: verbalized  understanding, returned demonstration, verbal cues required, tactile cues required, and needs further education   HOME EXERCISE PROGRAM:  Access Code: [email or web address] URL: [email or web address] Date: 08/12/2022 Prepared by: Gillermo Murdoch  Exercises - Seated Scapular Retraction  - 1 x daily - 7 x weekly - 3 sets - 10 reps - Supine Cervical Flexion Extension on Pillow  - 1 x daily - 7 x weekly - 3 sets - 10 reps - Seated Thoracic Lumbar Extension with Pectoralis Stretch  - 1 x daily - 7 x weekly - 1 sets - 10 reps - Seated Cervical Retraction  - 1 x daily - 7 x weekly - 2 sets - 10 reps - Prone W Scapular Retraction  - 1 x daily - 7 x weekly - 2 sets - 10 reps - Supine Cervical Retraction with Towel  - 2 x daily - 7 x weekly - 1 sets - 5-10 reps - 10 sec  hold - Supine Scapular Retraction  - 2 x daily - 7 x weekly - 1 sets - 10 reps - 5-10 sec  hold - Supine Chest Stretch on Foam Roll  - 2 x daily - 7 x weekly - 1 sets - 1 reps - 2-5 min  sec  hold  ASSESSMENT:  CLINICAL IMPRESSION: Cervical gliding mobilization performed with patient demonstrating improved cervical rotation AROM after treatment; patient continues to exhibit decreased ROM to right as compared to left. Patient demonstrated improved postural alignment and tolerance with prolonged sitting and overhead reaching activities, reporting 4/10 pain on NPS. Patient demonstrates good progress with cervical ROM and decrease  in subjective pain levels. Pt has met his LTGs; however, Patient will continue to benefit from continued to skilled therapy to address decreased cervical rotation and extension ROM and tolerance with job-related activities. Updated goals accordingly as pt continues to make good gains. Pt able to maintain proper head/neck alignment for short periods but does not have endurance to maintain for a full day and continues to have some pain (mostly along R suboccipital and posterior to mastoid process)  OBJECTIVE  IMPAIRMENTS: Abnormal gait, decreased activity tolerance, decreased mobility, difficulty walking, decreased ROM, decreased strength, hypomobility, increased muscle spasms, impaired flexibility, impaired UE functional use, improper body mechanics, postural dysfunction, and pain.   ACTIVITY LIMITATIONS: carrying, lifting, bending, sitting, standing, stairs, dressing, and reach over head  PARTICIPATION LIMITATIONS: driving, shopping, community activity, and occupation  PERSONAL FACTORS: Age, Profession, Time since onset of injury/illness/exacerbation, and 3+ comorbidities: HTN, anxiety, CAD s/p stent  are also affecting patient's functional outcome.   REHAB POTENTIAL: Fair given severity of deficits and comorbidities  CLINICAL DECISION MAKING: Evolving/moderate complexity  EVALUATION COMPLEXITY: Moderate   GOALS: Goals reviewed with patient? No    LONG TERM GOALS: Target date: 09/12/2022  Pt will improve greater than 2x MCID on FOTO in order to demonstrate improved perception of function due to symptoms. Baseline: deferred on eval given time constraints Goal status: DEFERRED d/t no previous FOTO  Pt will demonstrate ability to maintain grossly neutral sitting posture for >20mn with less than 6/10 pain on NPS in order to indicate improved functional tolerance of postural musculature. Baseline: profound forward head posture, no active cervical extension against gravity, 9/10 pain NPS Goal status: MET  12/12: 4/10 pain with decreased forward head posture  Pt will demonstrate ability to actively extend cervical spine to neutral with <6/10 pain on NPS in order to improve environmental awareness/safety with activity.  Baseline: see ROM chart above Goal status: MET  12/12: 4/10 with decreased forward head posture  Pt will report/demonstrate ability to work overhead with B UE for >237m continuously with less than 2 point increase from baseline on NPS in order to demonstrate improved tolerance  to work activities.  Baseline: Requires unilateral UE supporting head for any activities at eye level or above Goal status: MET 12/12: completed task with no increase in pain   REVISED LONG TERM GOALS: Target date: 10/29/2022  1.  Pt will be ind with maintaining progression on HEP Baseline:  Goal status: INITIAL  2.  Pt will demo improved cervical ROM by at least 10% in all directions Baseline:  Goal status: INITIAL  3.  Pt will report neck pain to </=2/10 with all activities Baseline:  Goal status: INITIAL    PLAN:  PT FREQUENCY: 1-2x/week  PT DURATION: 6 weeks  PLANNED INTERVENTIONS: Therapeutic exercises, Therapeutic activity, Neuromuscular re-education, Balance training, Gait training, Patient/Family education, Self Care, Joint mobilization, Stair training, Dry Needling, Electrical stimulation, Spinal mobilization, Cryotherapy, Moist heat, Taping, Manual therapy, and Re-evaluation  PLAN FOR NEXT SESSION: Progress ROM/strengthening exercises as tolerated. Progress to against gravity; increase job-related tasks. Progress HEP.  KaHardin NegusPTA 09/17/2022 9:04 AM   GeEstill Bambergpril Ma L Nonato, PT 09/17/2022, 10:05 AM

## 2022-09-17 NOTE — Addendum Note (Signed)
Addended by: Jules Husbands MARIE L on: 09/17/2022 10:06 AM   Modules accepted: Orders

## 2022-09-20 ENCOUNTER — Ambulatory Visit: Payer: No Typology Code available for payment source | Admitting: Physical Therapy

## 2022-09-20 ENCOUNTER — Encounter: Payer: Self-pay | Admitting: Physical Therapy

## 2022-09-20 DIAGNOSIS — M6281 Muscle weakness (generalized): Secondary | ICD-10-CM

## 2022-09-20 DIAGNOSIS — R293 Abnormal posture: Secondary | ICD-10-CM

## 2022-09-20 DIAGNOSIS — R2689 Other abnormalities of gait and mobility: Secondary | ICD-10-CM

## 2022-09-20 DIAGNOSIS — M542 Cervicalgia: Secondary | ICD-10-CM

## 2022-09-20 NOTE — Therapy (Signed)
OUTPATIENT PHYSICAL THERAPY TREATMENT AND RE-CERT   Patient Name: Earl Campbell MRN: [id number] DOB:11-04-1960, 61 y.o., male Today's Date: 09/20/2022   PT End of Session - 09/20/22 0800     Visit Number 9    Number of Visits 12    Date for PT Re-Evaluation 10/29/22    PT Start Time 0800    PT Stop Time 0840    PT Time Calculation (min) 40 min    Activity Tolerance Patient tolerated treatment well    Behavior During Therapy [facility] for tasks assessed/performed             Past Medical History:  Diagnosis Date   CAD S/P percutaneous coronary angioplasty 07/2007    abnormal nuclear ST --> Cath --> 80%mLAD - PCI with Promus DES 3.0 mm x 23 mm (~3.2 mm).[place], [place] stent with ~30% RCA on relook cath 04/2008   COPD (chronic obstructive pulmonary disease) ([place])    Hyperlipidemia    Hypertension    Past Surgical History:  Procedure Laterality Date   CARDIAC CATHETERIZATION  07/30/2007   (Dr. Monica Martinez --[facility] [place], [place]): abnormal Nuclear ST ordered to evaluate chest pain- 80% mLAD just after the major diagonal--> PCI; small ramus intermedius noted.  That the circumflex are relatively normal.  Dominant RCA relatively normal.  EF 70%.   CARDIAC CATHETERIZATION  04/2008   [place], [place]; Patent mLAD stent, 30%.  EF at least 55%.   CORONARY STENT PLACEMENT  07/30/2007   PCI - mLAD - Promus DES 3.0 mm x 23 mm (3.2 mm).   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 "Same spot." Pt is wondering about further scans to see if there's any tears.   PERTINENT HISTORY:  CAD s/p stent, HTN, anxiety, HEP C, ETOH; recent fall landing on face From eval: Fall 10 weeks ago hitting his head resulting in immediate pain/weakness following day  PAIN:  Are you having pain: 7/10 Location: R upper neck How would you describe your pain? "Just hurts" Aggravating factors: carrying, bending, lifting, sitting, using UEs Easing factors: soft collar, holding head up, drinking beer/medication  PRECAUTIONS: none (has been using soft collar for comfort)  WEIGHT BEARING RESTRICTIONS: No  FALLS:  Has patient fallen in last 6 months? 1 fall, precipitating episode. Notes some "clumsiness" with navigating body shop and stairs but denies any overt changes since fall  LIVING ENVIRONMENT: Lives alone - just moved to one level apartment. A couple steps to enter no rails, pt states it is difficult to enter  OCCUPATION: works in [employer], has to be very active for jobs   PLOF: Independent  PATIENT GOALS: wants neck back to normal  OBJECTIVE:   From eval unless otherwise noted  PALPATION: No TTP but significant palpable tension B UT, LS, and paracervical musculature. No bony tenderness or deformity. Pt  reports significant relief with STM   CERVICAL ROM:   Active ROM A/PROM (deg) eval AROM 11/14 AROM 12/12  Flexion Able to bring chin to chest    Extension No active cervical extension, see comments    Right lateral flexion ~25%    Left lateral flexion ~10%    Right rotation 18 25 40  Left rotation 20 30 52   (Blank rows = not tested) Comments: mild pain with B rotation, about equal; for cervical extension - no movement against gravity although compensations noted at T spine. Passively able to achieve neutral in seated position, noted ability to extend past neutral in gravity eliminated position Rotation/sidebending measurements taken in pt resting position (notably forward head)  UPPER EXTREMITY ROM:  Active ROM Right eval Left eval  Shoulder flexion [facility]  Shoulder abduction [facility]  Shoulder internal rotation    Shoulder external rotation    Elbow flexion [facility] WFL  Elbow extension [facility] WFL  Wrist flexion [facility] WFL  Wrist extension WFL WFL   (Blank rows = not tested) Comments:elevation WNL B and painless  UPPER EXTREMITY MMT:  MMT Right eval Left eval  Shoulder flexion 5 4+  Shoulder extension    Shoulder abduction 5 5  Shoulder extension    Shoulder internal rotation    Shoulder external rotation    Elbow flexion 5 5  Elbow extension 4 4  Grip strength 52# 64#   (Blank rows = not tested)  Comments: Pt left hand dominant at baseline  FUNCTIONAL TESTS:  Grip strength as above Gait: No AD, completely independent within clinic. Widened BOS, reduced truncal ROM and arm swing. Minimal hip extension B. Partial step through pattern. No overt  incoordination/ataxia  [facility] Adult PT Treatment:                                                DATE: 09/20/22 Therapeutic Exercise: Thoracic extension against chair 2x30 sec; added rotation x30" B Cervical rotation x10 AROM Cervical side bending 2x30 sec Cervical retraction x10  Levator scap  stretch 2x30 sec Manual  Therapy: Sitting: STM & TPR sub-occipitals; STM cervical paraspinals & UT Thoracic PAs grade II to III  Cervical ROM all directions MWM Cervical side gliding mobs; cervical rotation with lateral glide mobs Self Care: Self STM & TPR of suboccipitals Modalities: TENS to pt tolerance x 15 min to ease muscle spasm/pain with moist heat   [facility] Adult PT Treatment:                                                DATE: 09/17/22 Therapeutic Exercise: Sitting Pulleys: flex/scap x2 min each Thoracic extension against chair x30 sec; added rotation x30" B Cervical rotation x10 AROM Cervical side bending 2x30 sec Cervical ext x10 AROM Cervical rotation x10 AROM Standing Leaning against wall (noodle vert at back) with cervical retraction 3x10 sec hold Leaning against wall chin tuck + horizontal abd green TB 2x10 Leaning against wall chin tuck + shoulder ext green TB 2x10 2 min overhead task to test long-term goals Manual Therapy: Sitting: STM & TPR sub-occipitals; STM cervical paraspinals & UT Cervical ROM all directions MWM Cervical side gliding mobs; cervical rotation with lateral glide mobs   [facility] Adult PT Treatment:                                                DATE: 09/10/22 K tape as follows with additional "I" strip along upper thoracic spine to facilitate cervical and thoracic paraspinals    PATIENT EDUCATION:  Education details: HEP updates to decrease R side neck pain Person educated: Patient Education method: Explanation, Demonstration, Tactile cues, Verbal cues, and Handouts Education comprehension: verbalized understanding, returned demonstration, verbal cues required, tactile cues required, and needs further education   HOME EXERCISE PROGRAM:  Access Code: [email or web address] URL: [email or web address] Date: 08/12/2022 Prepared by: Gillermo Murdoch  Exercises - Seated Scapular Retraction  - 1 x daily - 7 x weekly - 3 sets - 10 reps - Supine Cervical Flexion Extension on  Pillow  - 1 x daily - 7 x weekly - 3 sets - 10 reps - Seated Thoracic Lumbar Extension with Pectoralis Stretch  - 1 x daily - 7 x weekly - 1 sets - 10 reps - Seated Cervical Retraction  - 1 x daily - 7 x weekly - 2 sets - 10 reps - Prone W Scapular Retraction  - 1 x daily - 7 x weekly - 2 sets - 10 reps - Supine Cervical Retraction with Towel  - 2 x daily - 7 x weekly - 1 sets - 5-10 reps - 10 sec  hold - Supine Scapular Retraction  - 2 x daily - 7 x weekly - 1 sets - 10 reps - 5-10 sec  hold - Supine Chest Stretch on Foam Roll  - 2 x daily - 7 x weekly - 1 sets - 1 reps - 2-5 min  sec  hold  ASSESSMENT:  CLINICAL IMPRESSION: Session focused primarily on addressing pt's continued R side neck pain. Increased muscular tension this session particularly in suboccipitals, upper trap and cervical paraspinals. Encouraged pt to perform stretching to address this muscle tension. Discussed at length further imaging if he wants to know if he has any other tears in his neck  not seen on x-ray.   OBJECTIVE IMPAIRMENTS: Abnormal gait, decreased activity tolerance, decreased mobility, difficulty walking, decreased ROM, decreased strength, hypomobility, increased muscle spasms, impaired flexibility, impaired UE functional use, improper body mechanics, postural dysfunction, and pain.    GOALS: Goals reviewed with patient? No    LONG TERM GOALS: Target date: 09/12/2022  Pt will improve greater than 2x MCID on FOTO in order to demonstrate improved perception of function due to symptoms. Baseline: deferred on eval given time constraints Goal status: DEFERRED d/t no previous FOTO  Pt will demonstrate ability to maintain grossly neutral sitting posture for >18mn with less than 6/10 pain on NPS in order to indicate improved functional tolerance of postural musculature. Baseline: profound forward head posture, no active cervical extension against gravity, 9/10 pain NPS Goal status: MET  12/12: 4/10 pain with  decreased forward head posture  Pt will demonstrate ability to actively extend cervical spine to neutral with <6/10 pain on NPS in order to improve environmental awareness/safety with activity.  Baseline: see ROM chart above Goal status: MET  12/12: 4/10 with decreased forward head posture  Pt will report/demonstrate ability to work overhead with B UE for >215m continuously with less than 2 point increase from baseline on NPS in order to demonstrate improved tolerance to work activities.  Baseline: Requires unilateral UE supporting head for any activities at eye level or above Goal status: MET 12/12: completed task with no increase in pain   REVISED LONG TERM GOALS: Target date: 10/29/2022  1.  Pt will be ind with maintaining progression on HEP Baseline:  Goal status: INITIAL  2.  Pt will demo improved cervical ROM by at least 10% in all directions Baseline:  Goal status: INITIAL  3.  Pt will report neck pain to </=2/10 with all activities Baseline:  Goal status: INITIAL    PLAN:  PT FREQUENCY: 1-2x/week  PT DURATION: 6 weeks  PLANNED INTERVENTIONS: Therapeutic exercises, Therapeutic activity, Neuromuscular re-education, Balance training, Gait training, Patient/Family education, Self Care, Joint mobilization, Stair training, Dry Needling, Electrical stimulation, Spinal mobilization, Cryotherapy, Moist heat, Taping, Manual therapy, and Re-evaluation  PLAN FOR NEXT SESSION: Progress ROM/strengthening exercises as tolerated. Progress to against gravity; increase job-related tasks. Progress HEP.  Daisia Slomski April Ma L Demonta Wombles, PT 09/20/2022 8:01 AM

## 2022-09-24 ENCOUNTER — Encounter: Payer: Self-pay | Admitting: Physical Therapy

## 2022-09-24 ENCOUNTER — Ambulatory Visit: Payer: No Typology Code available for payment source | Admitting: Physical Therapy

## 2022-09-24 DIAGNOSIS — R2689 Other abnormalities of gait and mobility: Secondary | ICD-10-CM

## 2022-09-24 DIAGNOSIS — M6281 Muscle weakness (generalized): Secondary | ICD-10-CM

## 2022-09-24 DIAGNOSIS — R293 Abnormal posture: Secondary | ICD-10-CM

## 2022-09-24 DIAGNOSIS — M542 Cervicalgia: Secondary | ICD-10-CM | POA: Diagnosis not present

## 2022-09-24 NOTE — Therapy (Addendum)
OUTPATIENT PHYSICAL THERAPY TREATMENT AND DISCHARGE   Patient Name: Earl Campbell MRN: [id number] DOB:1961-08-30, 61 y.o., male Today's Date: 09/24/2022   PT End of Session - 09/24/22 0802     Visit Number 10    Number of Visits 12    Date for PT Re-Evaluation 10/29/22    PT Start Time 0802    PT Stop Time 0845    PT Time Calculation (min) 43 min    Activity Tolerance Patient tolerated treatment well    Behavior During Therapy [facility] for tasks assessed/performed            PHYSICAL THERAPY DISCHARGE SUMMARY  Visits from Start of Care: 10  Current functional level related to goals / functional outcomes: See below   Remaining deficits: See below   Education / Equipment: See below   Patient agrees to discharge. Patient goals were partially met. Patient is being discharged due to not returning since the last visit.   Past Medical History:  Diagnosis Date   CAD S/P percutaneous coronary angioplasty 07/2007    abnormal nuclear ST --> Cath --> 80%mLAD - PCI with Promus DES 3.0 mm x 23 mm (~3.2 mm).[place], [place] stent with ~30% RCA on relook cath 04/2008   COPD (chronic obstructive pulmonary disease) ([place])    Hyperlipidemia    Hypertension    Past Surgical History:  Procedure Laterality Date   CARDIAC CATHETERIZATION  07/30/2007   (Dr. Monica Martinez --[facility] [place], [place]): abnormal Nuclear ST ordered to evaluate chest pain- 80% mLAD just after the major diagonal--> PCI; small ramus intermedius noted.  That the circumflex are relatively normal.  Dominant RCA relatively normal.  EF 70%.   CARDIAC CATHETERIZATION  04/2008   [place], [place]; [place] mLAD stent, 30%.  EF at least 55%.   CORONARY STENT PLACEMENT  07/30/2007   PCI - mLAD - Promus DES 3.0 mm x 23 mm (3.2 mm).   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 Pt states that he is hurting a lot in his neck.    PERTINENT HISTORY:  CAD s/p stent, HTN, anxiety, HEP C, ETOH; recent fall landing on face From eval: Fall 10 weeks ago hitting his head resulting in immediate pain/weakness following day  PAIN:  Are you having pain: 7/10 Location: R upper neck How would you describe your pain? "Just hurts" Aggravating factors: carrying, bending, lifting, sitting, using UEs Easing factors: soft collar, holding head up, drinking beer/medication  PRECAUTIONS: none (has been using soft collar for comfort)  WEIGHT BEARING RESTRICTIONS: No  FALLS:  Has patient fallen in last 6 months? 1 fall, precipitating episode. Notes some "clumsiness" with navigating body shop and stairs but denies any overt changes since fall  LIVING ENVIRONMENT: Lives alone - just moved to one  level apartment. A couple steps to enter no rails, pt states it is difficult to enter  OCCUPATION: works in [employer], has to be very active for jobs   PLOF: Independent  PATIENT GOALS: wants neck back to normal  OBJECTIVE:   From eval unless otherwise noted  PALPATION: No TTP but significant palpable tension B UT, LS, and paracervical musculature. No bony tenderness or deformity. Pt reports significant relief with STM   CERVICAL ROM:   Active ROM A/PROM (deg) eval AROM 11/14 AROM 12/12   Flexion Able to bring chin to chest     Extension No active cervical extension, see comments   118-90  Right lateral flexion ~25%   30  Left lateral flexion ~10%   25  Right rotation 18 25 40 38  Left rotation 20 30 52 50   (Blank rows = not tested) Comments: mild pain with B rotation, about equal; for cervical extension - no movement against gravity although compensations noted at T spine. Passively able to achieve neutral in seated position, noted ability to extend past neutral in gravity eliminated position Rotation/sidebending measurements taken in pt resting position (notably forward head)  UPPER EXTREMITY ROM:  Active ROM Right eval Left eval  Shoulder flexion [facility]  Shoulder abduction [facility]  Shoulder internal rotation    Shoulder external rotation    Elbow flexion [facility] WFL  Elbow extension [facility] WFL  Wrist flexion [facility] WFL  Wrist extension WFL WFL   (Blank rows = not tested) Comments:elevation WNL B and painless  UPPER EXTREMITY MMT:  MMT Right eval Left eval  Shoulder flexion 5 4+  Shoulder extension    Shoulder abduction 5 5  Shoulder extension    Shoulder internal rotation    Shoulder external rotation    Elbow flexion 5 5  Elbow extension 4 4  Grip strength 52# 64#   (Blank rows = not tested)  Comments: Pt left hand dominant at baseline  FUNCTIONAL TESTS:  Grip strength as above Gait: No AD, completely independent within clinic. Widened BOS, reduced truncal  ROM and arm swing. Minimal hip extension B. Partial step through pattern. No overt  incoordination/ataxia   [facility] Adult PT Treatment:                                                DATE: 09/24/22 Therapeutic Exercise: UBE L3 x 4  min alt fwd/bwd Thoracic extension against chair 2x30 sec Cervical rotation 5x10 sec Cervical retraction x 30 sec Shoulder horizontal abd 2x10 green TB Diagonals 2x10 green TB ROM measurements obtained Manual Therapy: STM & TPR UT, cervical paraspinal, levator scap, suboccipitals Skilled assessment and palpation for TPDN Trigger Point Dry-Needling  Treatment instructions: Expect mild to moderate muscle soreness. S/S of pneumothorax if dry needled over a lung field, and to seek immediate medical attention should they occur. Patient verbalized understanding of these instructions and education.  Patient Consent Given: Yes Education handout provided: Yes Muscles treated: cervical paraspinals, UT, posterior scalene, suboccipitals Electrical stimulation performed: No Parameters: N/A Treatment response/outcome: Twitch response ilicited, palpable increase in muscle length   [facility] Adult PT Treatment:                                                DATE: 09/20/22 Therapeutic Exercise: Thoracic extension against chair 2x30 sec; added rotation x30" B Cervical rotation x10 AROM Cervical side bending 2x30 sec Cervical retraction x10  Levator scap stretch 2x30 sec Manual Therapy: Sitting: STM & TPR sub-occipitals; STM cervical paraspinals & UT Thoracic PAs grade II to III  Cervical ROM all directions MWM Cervical side gliding mobs; cervical rotation with lateral glide mobs Self Care: Self STM & TPR of suboccipitals Modalities: TENS to pt tolerance x 15 min to ease muscle spasm/pain with moist heat   [facility] Adult PT Treatment:                                                DATE: 09/17/22 Therapeutic Exercise: Sitting Pulleys: flex/scap x2 min each Thoracic  extension against chair x30 sec; added rotation x30" B Cervical rotation x10 AROM Cervical side bending 2x30 sec Cervical ext x10 AROM Cervical rotation x10 AROM Standing Leaning against wall (noodle vert at back) with cervical retraction 3x10 sec hold Leaning against wall chin tuck + horizontal abd green TB 2x10 Leaning against wall chin tuck + shoulder ext green TB 2x10 2 min overhead task to test long-term goals Manual Therapy: Sitting: STM & TPR sub-occipitals; STM cervical paraspinals & UT Cervical ROM all directions MWM Cervical side gliding mobs; cervical rotation with lateral glide mobs     PATIENT EDUCATION:  Education details: HEP updates to decrease R side neck pain Person educated: Patient Education method: Explanation, Demonstration, Tactile cues, Verbal cues, and Handouts Education comprehension: verbalized understanding, returned demonstration, verbal cues required, tactile cues required, and needs further education   HOME EXERCISE PROGRAM:  Access Code: [email or web address] URL: [email or web address] Date: 08/12/2022 Prepared by: Gillermo Murdoch  Exercises - Seated Scapular Retraction  - 1 x daily - 7 x weekly - 3 sets - 10 reps - Supine Cervical Flexion Extension on Pillow  - 1 x daily - 7 x weekly - 3 sets - 10 reps - Seated Thoracic Lumbar Extension with Pectoralis Stretch  - 1 x daily - 7 x weekly - 1 sets - 10 reps - Seated Cervical Retraction  - 1 x daily - 7 x weekly - 2 sets - 10 reps - Prone W Scapular Retraction  - 1 x daily - 7 x weekly - 2 sets - 10 reps - Supine Cervical Retraction with Towel  -  2 x daily - 7 x weekly - 1 sets - 5-10 reps - 10 sec  hold - Supine Scapular Retraction  - 2 x daily - 7 x weekly - 1 sets - 10 reps - 5-10 sec  hold - Supine Chest Stretch on Foam Roll  - 2 x daily - 7 x weekly - 1 sets - 1 reps - 2-5 min  sec  hold  ASSESSMENT:  CLINICAL IMPRESSION: Performed trial of TPDN this session to address pt's R side neck pain.  Pt remains concerned about his pain level and is waiting to get CT scan in [place]. Continued to work on improving cervical extensor and deep neck flexor endurance/activation.   OBJECTIVE IMPAIRMENTS: Abnormal gait, decreased activity tolerance, decreased mobility, difficulty walking, decreased ROM, decreased strength, hypomobility, increased muscle spasms, impaired flexibility, impaired UE functional use, improper body mechanics, postural dysfunction, and pain.    GOALS: Goals reviewed with patient? No    LONG TERM GOALS: Target date: 09/12/2022  Pt will improve greater than 2x MCID on FOTO in order to demonstrate improved perception of function due to symptoms. Baseline: deferred on eval given time constraints Goal status: DEFERRED d/t no previous FOTO  Pt will demonstrate ability to maintain grossly neutral sitting posture for >62mn with less than 6/10 pain on NPS in order to indicate improved functional tolerance of postural musculature. Baseline: profound forward head posture, no active cervical extension against gravity, 9/10 pain NPS Goal status: MET  12/12: 4/10 pain with decreased forward head posture  Pt will demonstrate ability to actively extend cervical spine to neutral with <6/10 pain on NPS in order to improve environmental awareness/safety with activity.  Baseline: see ROM chart above Goal status: MET  12/12: 4/10 with decreased forward head posture  Pt will report/demonstrate ability to work overhead with B UE for >231m continuously with less than 2 point increase from baseline on NPS in order to demonstrate improved tolerance to work activities.  Baseline: Requires unilateral UE supporting head for any activities at eye level or above Goal status: MET 12/12: completed task with no increase in pain   REVISED LONG TERM GOALS: Target date: 10/29/2022  1.  Pt will be ind with maintaining progression on HEP Baseline:  Goal status: INITIAL  2.  Pt will demo improved  cervical ROM by at least 10% in all directions Baseline:  Goal status: INITIAL  3.  Pt will report neck pain to </=2/10 with all activities Baseline:  Goal status: INITIAL    PLAN:  PT FREQUENCY: 1-2x/week  PT DURATION: 6 weeks  PLANNED INTERVENTIONS: Therapeutic exercises, Therapeutic activity, Neuromuscular re-education, Balance training, Gait training, Patient/Family education, Self Care, Joint mobilization, Stair training, Dry Needling, Electrical stimulation, Spinal mobilization, Cryotherapy, Moist heat, Taping, Manual therapy, and Re-evaluation  PLAN FOR NEXT SESSION: Progress ROM/strengthening exercises as tolerated. Progress to against gravity; increase job-related tasks. Progress HEP.  Jerrett Baldinger April Ma L Della Homan, PT 09/24/2022 8:03 AM

## 2022-09-27 ENCOUNTER — Ambulatory Visit: Payer: No Typology Code available for payment source | Admitting: Physical Therapy

## 2022-10-02 ENCOUNTER — Encounter: Payer: No Typology Code available for payment source | Admitting: Physical Therapy

## 2022-10-29 ENCOUNTER — Ambulatory Visit
Admission: RE | Admit: 2022-10-29 | Discharge: 2022-10-29 | Disposition: A | Discharge status: Home / Self Care | Payer: No Typology Code available for payment source | Source: Ambulatory Visit | Attending: Neurosurgery

## 2022-10-29 ENCOUNTER — Ambulatory Visit
Admission: RE | Admit: 2022-10-29 | Discharge: 2022-10-29 | Disposition: A | Discharge status: Home / Self Care | Payer: No Typology Code available for payment source | Source: Ambulatory Visit | Attending: Neurosurgery | Admitting: Neurosurgery

## 2022-10-29 DIAGNOSIS — R29898 Other symptoms and signs involving the musculoskeletal system: Secondary | ICD-10-CM

## 2023-07-28 ENCOUNTER — Encounter: Payer: Self-pay | Admitting: Gastroenterology

## 2023-07-28 ENCOUNTER — Other Ambulatory Visit: Payer: Self-pay | Admitting: Gastroenterology

## 2023-07-28 DIAGNOSIS — K7460 Unspecified cirrhosis of liver: Secondary | ICD-10-CM

## 2023-08-06 ENCOUNTER — Other Ambulatory Visit: Payer: Self-pay | Admitting: Internal Medicine

## 2023-08-06 DIAGNOSIS — N1832 Chronic kidney disease, stage 3b: Secondary | ICD-10-CM

## 2023-08-11 ENCOUNTER — Inpatient Hospital Stay
Admission: RE | Admit: 2023-08-11 | Discharge: 2023-08-11 | Discharge status: Home / Self Care | Payer: No Typology Code available for payment source | Source: Ambulatory Visit | Attending: Internal Medicine | Admitting: Internal Medicine

## 2023-08-11 DIAGNOSIS — N1832 Chronic kidney disease, stage 3b: Secondary | ICD-10-CM

## 2023-09-18 ENCOUNTER — Other Ambulatory Visit: Payer: Self-pay | Admitting: Gastroenterology

## 2023-09-22 ENCOUNTER — Ambulatory Visit
Admission: RE | Admit: 2023-09-22 | Discharge: 2023-09-22 | Disposition: A | Discharge status: Home / Self Care | Payer: No Typology Code available for payment source | Source: Ambulatory Visit | Attending: Gastroenterology | Admitting: Gastroenterology

## 2023-09-22 DIAGNOSIS — K7460 Unspecified cirrhosis of liver: Secondary | ICD-10-CM

## 2023-10-10 ENCOUNTER — Encounter (HOSPITAL_COMMUNITY): Payer: Self-pay | Admitting: Gastroenterology

## 2023-10-21 ENCOUNTER — Ambulatory Visit (HOSPITAL_COMMUNITY): Payer: No Typology Code available for payment source

## 2023-10-21 ENCOUNTER — Encounter (HOSPITAL_COMMUNITY)
Admission: RE | Disposition: A | Discharge status: Home / Self Care | Payer: Self-pay | Source: Home / Self Care | Attending: Gastroenterology

## 2023-10-21 ENCOUNTER — Encounter (HOSPITAL_COMMUNITY): Payer: Self-pay | Admitting: Gastroenterology

## 2023-10-21 ENCOUNTER — Other Ambulatory Visit: Payer: Self-pay

## 2023-10-21 ENCOUNTER — Ambulatory Visit (HOSPITAL_COMMUNITY)
Admission: RE | Admit: 2023-10-21 | Discharge: 2023-10-21 | Disposition: A | Discharge status: Home / Self Care | Payer: No Typology Code available for payment source | Attending: Gastroenterology | Admitting: Gastroenterology

## 2023-10-21 DIAGNOSIS — K31819 Angiodysplasia of stomach and duodenum without bleeding: Secondary | ICD-10-CM | POA: Insufficient documentation

## 2023-10-21 DIAGNOSIS — K5520 Angiodysplasia of colon without hemorrhage: Secondary | ICD-10-CM | POA: Insufficient documentation

## 2023-10-21 DIAGNOSIS — K3189 Other diseases of stomach and duodenum: Secondary | ICD-10-CM | POA: Diagnosis not present

## 2023-10-21 DIAGNOSIS — Z860100 Personal history of colon polyps, unspecified: Secondary | ICD-10-CM | POA: Insufficient documentation

## 2023-10-21 DIAGNOSIS — Q2733 Arteriovenous malformation of digestive system vessel: Secondary | ICD-10-CM | POA: Diagnosis not present

## 2023-10-21 DIAGNOSIS — K449 Diaphragmatic hernia without obstruction or gangrene: Secondary | ICD-10-CM | POA: Insufficient documentation

## 2023-10-21 DIAGNOSIS — K766 Portal hypertension: Secondary | ICD-10-CM | POA: Diagnosis not present

## 2023-10-21 DIAGNOSIS — F17210 Nicotine dependence, cigarettes, uncomplicated: Secondary | ICD-10-CM | POA: Diagnosis not present

## 2023-10-21 DIAGNOSIS — D509 Iron deficiency anemia, unspecified: Secondary | ICD-10-CM | POA: Diagnosis present

## 2023-10-21 DIAGNOSIS — J449 Chronic obstructive pulmonary disease, unspecified: Secondary | ICD-10-CM | POA: Diagnosis not present

## 2023-10-21 DIAGNOSIS — K644 Residual hemorrhoidal skin tags: Secondary | ICD-10-CM | POA: Diagnosis not present

## 2023-10-21 DIAGNOSIS — K648 Other hemorrhoids: Secondary | ICD-10-CM | POA: Insufficient documentation

## 2023-10-21 DIAGNOSIS — D649 Anemia, unspecified: Secondary | ICD-10-CM | POA: Diagnosis not present

## 2023-10-21 DIAGNOSIS — I10 Essential (primary) hypertension: Secondary | ICD-10-CM | POA: Diagnosis not present

## 2023-10-21 DIAGNOSIS — Z1211 Encounter for screening for malignant neoplasm of colon: Secondary | ICD-10-CM | POA: Insufficient documentation

## 2023-10-21 DIAGNOSIS — I2510 Atherosclerotic heart disease of native coronary artery without angina pectoris: Secondary | ICD-10-CM | POA: Insufficient documentation

## 2023-10-21 HISTORY — DX: Inflammatory liver disease, unspecified: K75.9

## 2023-10-21 HISTORY — PX: COLONOSCOPY WITH PROPOFOL: SHX5780

## 2023-10-21 HISTORY — PX: ESOPHAGOGASTRODUODENOSCOPY (EGD) WITH PROPOFOL: SHX5813

## 2023-10-21 SURGERY — COLONOSCOPY WITH PROPOFOL
Anesthesia: Monitor Anesthesia Care | Laterality: Bilateral

## 2023-10-21 MED ORDER — PROPOFOL 500 MG/50ML IV EMUL
INTRAVENOUS | Status: DC | PRN
  Administered 2023-10-21: 175 ug/kg/min via INTRAVENOUS

## 2023-10-21 MED ORDER — LABETALOL HCL 5 MG/ML IV SOLN
10.0000 mg | Freq: Once | INTRAVENOUS | Status: AC
  Administered 2023-10-21: 10 mg via INTRAVENOUS

## 2023-10-21 MED ORDER — FENTANYL CITRATE (PF) 100 MCG/2ML IJ SOLN: 25.0000 ug | INTRAMUSCULAR | Status: DC | PRN

## 2023-10-21 MED ORDER — OXYCODONE HCL 5 MG PO TABS: 5.0000 mg | ORAL_TABLET | Freq: Once | ORAL | Status: DC | PRN

## 2023-10-21 MED ORDER — DROPERIDOL 2.5 MG/ML IJ SOLN: 0.6250 mg | Freq: Once | INTRAMUSCULAR | Status: DC | PRN

## 2023-10-21 MED ORDER — LABETALOL HCL 5 MG/ML IV SOLN
INTRAVENOUS | Status: AC
  Filled 2023-10-21: qty 4

## 2023-10-21 MED ORDER — ACETAMINOPHEN 10 MG/ML IV SOLN: 1000.0000 mg | Freq: Once | INTRAVENOUS | Status: DC | PRN

## 2023-10-21 MED ORDER — PROPOFOL 10 MG/ML IV BOLUS
INTRAVENOUS | Status: DC | PRN
  Administered 2023-10-21: 30 mg via INTRAVENOUS

## 2023-10-21 MED ORDER — SODIUM CHLORIDE 0.9 % IV SOLN: INTRAVENOUS | Status: DC

## 2023-10-21 MED ORDER — OXYCODONE HCL 5 MG/5ML PO SOLN
5.0000 mg | Freq: Once | ORAL | Status: DC | PRN
  Filled 2023-10-21: qty 5

## 2023-10-21 SURGICAL SUPPLY — 24 items
BLOCK BITE 60FR ADLT L/F BLUE (MISCELLANEOUS) ×1 IMPLANT
ELECT REM PT RETURN 9FT ADLT (ELECTROSURGICAL)
ELECTRODE REM PT RTRN 9FT ADLT (ELECTROSURGICAL) IMPLANT
FCP BXJMBJMB 240X2.8X (CUTTING FORCEPS)
FLOOR PAD 36X40 (MISCELLANEOUS) ×1
FORCEP RJ3 GP 1.8X160 W-NEEDLE (CUTTING FORCEPS) IMPLANT
FORCEPS BIOP RAD 4 LRG CAP 4 (CUTTING FORCEPS) IMPLANT
FORCEPS BIOP RJ4 240 W/NDL (CUTTING FORCEPS)
FORCEPS BXJMBJMB 240X2.8X (CUTTING FORCEPS) IMPLANT
INJECTOR/SNARE I SNARE (MISCELLANEOUS) IMPLANT
LUBRICANT JELLY 4.5OZ STERILE (MISCELLANEOUS) IMPLANT
MANIFOLD NEPTUNE II (INSTRUMENTS) IMPLANT
NDL SCLEROTHERAPY 25GX240 (NEEDLE) IMPLANT
NEEDLE SCLEROTHERAPY 25GX240 (NEEDLE)
PAD FLOOR 36X40 (MISCELLANEOUS) ×1 IMPLANT
PROBE APC STR FIRE (PROBE) IMPLANT
PROBE INJECTION GOLD 7FR (MISCELLANEOUS) IMPLANT
SNARE ROTATE MED OVAL 20MM (MISCELLANEOUS) IMPLANT
SNARE SHORT THROW 13M SML OVAL (MISCELLANEOUS) IMPLANT
SYR 50ML LL SCALE MARK (SYRINGE) IMPLANT
TRAP SPECIMEN MUCOUS 40CC (MISCELLANEOUS) IMPLANT
TUBING ENDO SMARTCAP PENTAX (MISCELLANEOUS) ×2 IMPLANT
TUBING IRRIGATION ENDOGATOR (MISCELLANEOUS) ×1 IMPLANT
WATER STERILE IRR 1000ML POUR (IV SOLUTION) IMPLANT

## 2023-10-21 NOTE — Anesthesia Postprocedure Evaluation (Signed)
 Anesthesia Post Note  Patient: Earl Campbell  Procedure(s) Performed: COLONOSCOPY WITH PROPOFOL  (Bilateral) ESOPHAGOGASTRODUODENOSCOPY (EGD) WITH PROPOFOL  (Bilateral)     Patient location during evaluation: PACU Anesthesia Type: MAC Level of consciousness: awake and alert Pain management: pain level controlled Vital Signs Assessment: post-procedure vital signs reviewed and stable Respiratory status: spontaneous breathing, nonlabored ventilation, respiratory function stable and patient connected to nasal cannula oxygen Cardiovascular status: stable and blood pressure returned to baseline Postop Assessment: no apparent nausea or vomiting Anesthetic complications: no   No notable events documented.  Last Vitals:  Vitals:   10/21/23 1250 10/21/23 1300  BP: (!) 204/79 (!) 193/84  Pulse: 73 76  Resp: 15 20  Temp:    SpO2: 98% 100%    Last Pain:  Vitals:   10/21/23 1300  TempSrc:   PainSc: 0-No pain                 Thom JONELLE Peoples

## 2023-10-21 NOTE — Transfer of Care (Signed)
 Immediate Anesthesia Transfer of Care Note  Patient: Earl Campbell  Procedure(s) Performed: COLONOSCOPY WITH PROPOFOL  (Bilateral) ESOPHAGOGASTRODUODENOSCOPY (EGD) WITH PROPOFOL  (Bilateral)  Patient Location: PACU  Anesthesia Type:MAC  Level of Consciousness: sedated, patient cooperative, and responds to stimulation  Airway & Oxygen Therapy: Patient Spontanous Breathing and Patient connected to face mask oxygen  Post-op Assessment: Report given to RN and Post -op Vital signs reviewed and stable  Post vital signs: Reviewed and stable  Last Vitals:  Vitals Value Taken Time  BP 184/67 10/21/23 1226  Temp    Pulse 86 10/21/23 1227  Resp 16 10/21/23 1227  SpO2 100 % 10/21/23 1227  Vitals shown include unfiled device data.  Last Pain:  Vitals:   10/21/23 1226  TempSrc:   PainSc: Asleep         Complications: No notable events documented.

## 2023-10-21 NOTE — Op Note (Signed)
 [facility] Patient Name: Earl Campbell Procedure Date: 10/21/2023 MRN: [id number] Attending MD: Oliva Boots , MD, [id number] Date of Birth: 04-27-1961 CSN: [id number] Age: 63 Admit Type: Outpatient Procedure:                Colonoscopy Indications:              High risk colon cancer surveillance: Personal                            history of colonic polyps, Last colonoscopy: May                            2022, anemia Providers:                Ozell Pouch, Curtistine Bishop, Technician, Oliva Boots, MD Referring MD:              Medicines:                Monitored Anesthesia Care Complications:            No immediate complications. Estimated Blood Loss:     Estimated blood loss: none. Procedure:                Pre-Anesthesia Assessment:                           - Prior to the procedure, a History and Physical                            was performed, and patient medications and                            allergies were reviewed. The patient's tolerance of                            previous anesthesia was also reviewed. The risks                            and benefits of the procedure and the sedation                            options and risks were discussed with the patient.                            All questions were answered, and informed consent                            was obtained. Prior Anticoagulants: The patient has                            taken no anticoagulant or antiplatelet agents. ASA                            Grade Assessment: III - A  patient with severe                            systemic disease. After reviewing the risks and                            benefits, the patient was deemed in satisfactory                            condition to undergo the procedure.                           - Prior to the procedure, a History and Physical                            was performed, and patient medications and                             allergies were reviewed. The patient's tolerance of                            previous anesthesia was also reviewed. The risks                            and benefits of the procedure and the sedation                            options and risks were discussed with the patient.                            All questions were answered, and informed consent                            was obtained. Prior Anticoagulants: The patient has                            taken no anticoagulant or antiplatelet agents. ASA                            Grade Assessment: III - A patient with severe                            systemic disease. After reviewing the risks and                            benefits, the patient was deemed in satisfactory                            condition to undergo the procedure.                           After obtaining informed consent, the colonoscope  was passed under direct vision. Throughout the                            procedure, the patient's blood pressure, pulse, and                            oxygen saturations were monitored continuously. The                            [id number]-HQ190L ([id number]) Olympus colonoscope was                            introduced through the anus and advanced to the the                            cecum, identified by appendiceal orifice and                            ileocecal valve. The ileocecal valve and the rectum                            were photographed. The colonoscopy was performed                            without difficulty. The patient tolerated the                            procedure well. The quality of the bowel                            preparation was fair overall with poor on the cecum                            and ascending and hepatic flexure and it improved                            as we went distally. Scope In: 12:02:31 PM Scope Out: 12:22:01 PM Scope Withdrawal Time:  0 hours 11 minutes 41 seconds  Total Procedure Duration: 0 hours 19 minutes 30 seconds  Findings:      External and internal hemorrhoids were found during retroflexion, during       perianal exam and during digital exam. The hemorrhoids were small.      A single small angioectasia without bleeding was found in the proximal       ascending colon.      The entire examined colon appeared normal on direct and retroflexion       views. No polyps were seen under constraints of prep as above and we did       readvanced to the cecum after slow withdrawal just to double check and       air was suctioned Impression:               - Preparation of the colon was fair.                           -  External and internal hemorrhoids.                           - A single non-bleeding colonic angioectasia.                           - The entire examined colon is normal on direct and                            retroflexion views.                           - No specimens collected. Moderate Sedation:      Not Applicable - Patient had care per Anesthesia. Recommendation:           - Patient has a contact number available for                            emergencies. The signs and symptoms of potential                            delayed complications were discussed with the                            patient. Return to normal activities tomorrow.                            Written discharge instructions were provided to the                            patient.                           - Soft diet today.                           - Continue present medications.                           - Repeat colonoscopy in 1-2 year for screening                            purposes. And hopefully he can follow the prep                            instruction                           - Return to GI office in 4 weeks.                           - Telephone GI clinic if symptomatic PRN. Procedure Code(s):        ---  Professional ---                           [phone], Colonoscopy, flexible; diagnostic, including  collection of specimen(s) by brushing or washing,                            when performed (separate procedure) Diagnosis Code(s):        --- Professional ---                           Z86.010, Personal history of colonic polyps                           K64.8, Other hemorrhoids                           K55.20, Angiodysplasia of colon without hemorrhage CPT copyright 2022 American [employer] Association. All rights reserved. The codes documented in this report are preliminary and upon coder review may  be revised to meet current compliance requirements. Oliva Boots, MD 10/21/2023 12:51:41 PM This report has been signed electronically. Number of Addenda: 0

## 2023-10-21 NOTE — Progress Notes (Addendum)
 Pt presented to endoscopy today for EGD/Colonoscopy with MD Outlaw (later scheduled with MD Magod d/t delay r/t NPO status). Post-procedure, pt noted to be hypertensive with SBP of 190-204. Pt does endorse taking lisinopril-hydrochlorothiazide and last took it this AM. MDA Treen notified. VO for 10 mg labetalol  IV push. Medication given as ordered.  Ozell VEAR Pouch, RN 10/21/23 1:07 PM  Addendum 1324: BP now 190/80, aligning with pt's pre-operative baseline. MDA Treen notified. Per MDA Treen, okay to discharge pt to home.  Ozell VEAR Pouch, RN 10/21/23 1:25 PM

## 2023-10-21 NOTE — Anesthesia Preprocedure Evaluation (Signed)
 Anesthesia Evaluation  Patient identified by MRN, date of birth, ID band Patient awake    Reviewed: Allergy & Precautions, H&P , NPO status , Patient's Chart, lab work & pertinent test results  Airway Mallampati: II  TM Distance: >3 FB Neck ROM: Full    Dental no notable dental hx.    Pulmonary COPD, Current Smoker   Pulmonary exam normal breath sounds clear to auscultation       Cardiovascular hypertension, + CAD and + Cardiac Stents  Normal cardiovascular exam Rhythm:Regular Rate:Normal     Neuro/Psych  PSYCHIATRIC DISORDERS Anxiety     negative neurological ROS     GI/Hepatic ,,,(+) Hepatitis -, Canemia, history of colon polyps   Endo/Other  negative endocrine ROS    Renal/GU negative Renal ROS  negative genitourinary   Musculoskeletal negative musculoskeletal ROS (+)    Abdominal   Peds negative pediatric ROS (+)  Hematology  (+) Blood dyscrasia, anemia   Anesthesia Other Findings   Reproductive/Obstetrics negative OB ROS                              Anesthesia Physical Anesthesia Plan  ASA: 3  Anesthesia Plan: MAC   Post-op Pain Management:    Induction: Intravenous  PONV Risk Score and Plan: Propofol  infusion and Treatment may vary due to age or medical condition  Airway Management Planned: Natural Airway  Additional Equipment:   Intra-op Plan:   Post-operative Plan:   Informed Consent: I have reviewed the patients History and Physical, chart, labs and discussed the procedure including the risks, benefits and alternatives for the proposed anesthesia with the patient or authorized representative who has indicated his/her understanding and acceptance.     Dental advisory given  Plan Discussed with: CRNA  Anesthesia Plan Comments: (Patient admits to drinking milk at 5am. Case delayed 6 hrs. )         Anesthesia Quick Evaluation

## 2023-10-21 NOTE — Progress Notes (Signed)
 Earl Campbell 11:20 AM  Subjective: Patient seen and examined in her office computer note reviewed and he is asymptomatic from a GI standpoint and my partner Dr. Burnette asked me to proceed and his hospital computer chart reviewed and he is not on any aspirin or blood thinners  Objective: Vital signs stable afebrile no acute distress exam please see preassessment evaluation  Assessment: Anemia cirrhosis history of colon polyps  Plan: Okay to proceed with endoscopy and colonoscopy today with anesthesia assistance  [facility]  office [phone] After 5PM or if no answer call [phone]

## 2023-10-21 NOTE — Discharge Instructions (Addendum)
 Begin with soft solids and slowly advance diet tomorrow and call if GI question or problem otherwise follow-up with Dr. Burnette in 1 month and probably repeat colonoscopy with increased proper prep in 1 to 2 years  YOU HAD AN ENDOSCOPIC PROCEDURE TODAY: Refer to the procedure report and other information in the discharge instructions given to you for any specific questions about what was found during the examination. If this information does not answer your questions, please call the [facility] office at [phone] to clarify.   YOU SHOULD EXPECT: Some feelings of bloating in the abdomen. Passage of more gas than usual. Walking can help get rid of the air that was put into your GI tract during the procedure and reduce the bloating. If you had a lower endoscopy (such as a colonoscopy or flexible sigmoidoscopy) you may notice spotting of blood in your stool or on the toilet paper. Some abdominal soreness may be present for a day or two, also.  DIET: Your first meal following the procedure should be a light meal and then it is ok to progress to your normal diet. A half-sandwich or bowl of soup is an example of a good first meal. Heavy or fried foods are harder to digest and may make you feel nauseous or bloated. Drink plenty of fluids but you should avoid alcoholic beverages for 24 hours.    ACTIVITY: Your care partner should take you home directly after the procedure. You should plan to take it easy, moving slowly for the rest of the day. You can resume normal activity the day after the procedure however YOU SHOULD NOT DRIVE, use power tools, machinery or perform tasks that involve climbing or major physical exertion for 24 hours (because of the sedation medicines used during the test).   SYMPTOMS TO REPORT IMMEDIATELY: A gastroenterologist can be reached at any hour. Please call [phone]  for any of the following symptoms:  Following lower endoscopy (colonoscopy, flexible sigmoidoscopy) Excessive  amounts of blood in the stool  Significant tenderness, worsening of abdominal pains  Swelling of the abdomen that is new, acute  Fever of 100 or higher  Following upper endoscopy (EGD, EUS, ERCP, esophageal dilation) Vomiting of blood or coffee ground material  New, significant abdominal pain  New, significant chest pain or pain under the shoulder blades  Painful or persistently difficult swallowing  New shortness of breath  Black, tarry-looking or red, bloody stools  FOLLOW UP:  If any biopsies were taken you will be contacted by phone or by letter within the next 1-3 weeks. Call [phone]  if you have not heard about the biopsies in 3 weeks.  Please also call with any specific questions about appointments or follow up tests.

## 2023-10-21 NOTE — Op Note (Signed)
 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 Estimated Blood Loss:     Estimated blood loss: none. Procedure:                Pre-Anesthesia Assessment:                           - Prior to the procedure, a History and Physical                            was performed, and patient medications and                            allergies were reviewed. The patient's tolerance of                            previous anesthesia was also reviewed. The risks                            and benefits of the procedure and the sedation                            options and risks were discussed with the patient.                            All questions were answered, and informed consent                            was obtained. Prior Anticoagulants: The patient has                            taken no anticoagulant or antiplatelet agents. ASA                            Grade Assessment: III - A patient with severe                            systemic disease. After reviewing the risks and  benefits, the patient was deemed in satisfactory                            condition to undergo the procedure.                           After obtaining informed consent, the endoscope was                            passed under direct vision. Throughout the                            procedure, the patient's blood pressure, pulse, and                             oxygen saturations were monitored continuously. The                            [id number]-H190 ([id number]) Olympus endoscope was introduced                            through the mouth, and advanced to the third part                            of duodenum. The upper GI endoscopy was                            accomplished without difficulty. The patient                            tolerated the procedure well. Scope In: Scope Out: Findings:      The larynx was normal.      A small hiatal hernia was present.      Mild portal hypertensive gastropathy was found in the cardia, in the       gastric fundus and in the proximal gastric body.      A single small angioectasia without bleeding was found in the second       portion of the duodenum.      The duodenal bulb, first portion of the duodenum and third portion of       the duodenum were normal.      The exam was otherwise without abnormality. Impression:               - Normal larynx.                           - Small hiatal hernia.                           - Portal hypertensive gastropathy.                           - A single non-bleeding angioectasia in the                            duodenum.                           -  Normal duodenal bulb, first portion of the                            duodenum and third portion of the duodenum.                           - The examination was otherwise normal.                           - No specimens collected. Moderate Sedation:      Not Applicable - Patient had care per Anesthesia. Recommendation:           - Patient has a contact number available for                            emergencies. The signs and symptoms of potential                            delayed complications were discussed with the                            patient. Return to normal activities tomorrow.                            Written discharge instructions were provided to the                            patient.                            - Soft diet today.                           - Continue present medications.                           - Return to GI clinic in 4 weeks.                           - Telephone GI clinic if symptomatic PRN.                           - Perform a colonoscopy today. Procedure Code(s):        --- Professional ---                           [id number], Esophagogastroduodenoscopy, flexible,                            transoral; diagnostic, including collection of                            specimen(s) by brushing or washing, when performed                            (separate procedure) Diagnosis Code(s):        ---  Professional ---                           K44.9, Diaphragmatic hernia without obstruction or                            gangrene                           K76.6, Portal hypertension                           K31.89, Other diseases of stomach and duodenum                           K31.819, Angiodysplasia of stomach and duodenum                            without bleeding                           D50.9, Iron  deficiency anemia, unspecified CPT copyright 2022 American Medical Association. All rights reserved. The codes documented in this report are preliminary and upon coder review may  be revised to meet current compliance requirements. Oliva Boots, MD 10/21/2023 12:44:06 PM This report has been signed electronically. Number of Addenda: 0

## 2023-10-23 ENCOUNTER — Other Ambulatory Visit (HOSPITAL_COMMUNITY): Payer: Self-pay | Admitting: Urology

## 2023-10-23 ENCOUNTER — Encounter (HOSPITAL_COMMUNITY): Payer: Self-pay | Admitting: Gastroenterology

## 2023-10-23 DIAGNOSIS — R339 Retention of urine, unspecified: Secondary | ICD-10-CM

## 2023-10-23 DIAGNOSIS — N1330 Unspecified hydronephrosis: Secondary | ICD-10-CM

## 2023-10-23 DIAGNOSIS — N135 Crossing vessel and stricture of ureter without hydronephrosis: Secondary | ICD-10-CM

## 2023-11-19 ENCOUNTER — Other Ambulatory Visit: Payer: Self-pay | Admitting: Radiology

## 2023-11-19 DIAGNOSIS — R339 Retention of urine, unspecified: Secondary | ICD-10-CM

## 2023-11-20 ENCOUNTER — Encounter (HOSPITAL_COMMUNITY): Payer: Self-pay

## 2023-11-20 ENCOUNTER — Ambulatory Visit (HOSPITAL_COMMUNITY)
Admission: RE | Admit: 2023-11-20 | Discharge: 2023-11-20 | Disposition: A | Discharge status: Home / Self Care | Payer: No Typology Code available for payment source | Source: Ambulatory Visit | Attending: Urology | Admitting: Urology

## 2023-11-20 DIAGNOSIS — R339 Retention of urine, unspecified: Secondary | ICD-10-CM

## 2023-11-20 DIAGNOSIS — N135 Crossing vessel and stricture of ureter without hydronephrosis: Secondary | ICD-10-CM

## 2023-11-20 DIAGNOSIS — N1330 Unspecified hydronephrosis: Secondary | ICD-10-CM

## 2023-11-20 MED ORDER — SODIUM CHLORIDE 0.9 % IV SOLN: 2.0000 g | INTRAVENOUS | Status: DC

## 2023-11-20 MED ORDER — SODIUM CHLORIDE 0.9 % IV SOLN: INTRAVENOUS | Status: DC

## 2023-11-20 NOTE — Progress Notes (Signed)
Interventional Radiology Brief Note:  Patient presented to for suprapubic catheter placement.  Unfortunately, he did not appropriately hold his aspirin.    Procedure to be rescheduled.   Loyce Dys, MS RD PA-C

## 2023-11-20 NOTE — H&P (Signed)
Chief Complaint: Urinary retention - image guided suprapubic catheter placement   Referring Provider(s): Alvester Morin Jannett Celestine, MD   Supervising Physician: Richarda Overlie  Patient Status: [facility] - Out-pt  History of Present Illness: Earl Campbell is a 63 y.o. male with a history of CAD s/p stent placement, HTN, anxiety, hepatitis C.  Pt presented to Dr. Alvester Morin with urinary retention and was found to have a urethral stricture and hydronephrosis.  Pt was referred to interventional radiology for image guided suprapubic catheter placement.    Patient is Full Code  Past Medical History:  Diagnosis Date   CAD S/P percutaneous coronary angioplasty 07/2007    abnormal nuclear ST --> Cath --> 80%mLAD - PCI with Promus DES 3.0 mm x 23 mm (~3.2 mm).[name], [place].  - Patent stent with ~30% RCA on relook cath 04/2008   COPD (chronic obstructive pulmonary disease) (HCC)    Hepatitis    Hyperlipidemia    Hypertension     Past Surgical History:  Procedure Laterality Date   CARDIAC CATHETERIZATION  07/30/2007   (Dr. Marquette Saa --[facility] [place], [place]): abnormal Nuclear ST ordered to evaluate chest pain- 80% mLAD just after the major diagonal--> PCI; small ramus intermedius noted.  That the circumflex are relatively normal.  Dominant RCA relatively normal.  EF 70%.   CARDIAC CATHETERIZATION  04/2008   Shirlee Latch [place], [place]; Patent mLAD stent, 30%.  EF at least 55%.   COLONOSCOPY WITH PROPOFOL Bilateral 10/21/2023   Procedure: COLONOSCOPY WITH PROPOFOL;  Surgeon: Vida Rigger, MD;  Location: [facility];  Service: Gastroenterology;  Laterality: Bilateral;   CORONARY STENT PLACEMENT  07/30/2007   PCI - mLAD - Promus DES 3.0 mm x 23 mm (3.2 mm).   ESOPHAGOGASTRODUODENOSCOPY (EGD) WITH PROPOFOL Bilateral 10/21/2023   Procedure: ESOPHAGOGASTRODUODENOSCOPY (EGD) WITH PROPOFOL;  Surgeon: Vida Rigger, MD;  Location: [facility];  Service:  Gastroenterology;  Laterality: Bilateral;   TONSILLECTOMY      Allergies: Patient has no known allergies.  Medications: Prior to Admission medications   Medication Sig Start Date End Date Taking? Authorizing Provider  aspirin 325 MG EC tablet Take 325 mg by mouth daily.    [provider]  benzonatate (TESSALON) 100 MG capsule Take 1 capsule (100 mg total) by mouth every 8 (eight) hours. 05/21/21   Valinda Hoar, NP  clonazePAM (KLONOPIN) 0.5 MG tablet  06/28/13   [provider]  guaiFENesin-dextromethorphan (ROBITUSSIN DM) 100-10 MG/5ML syrup Take 5 mLs by mouth every 4 (four) hours as needed for cough. 05/21/21   White, Elita Boone, NP  HYDROcodone-acetaminophen (NORCO/VICODIN) 5-325 MG tablet Take 1 tablet by mouth every 6 (six) hours as needed for moderate pain (pain score 4-6).    [provider]  lisinopril-hydrochlorothiazide (PRINZIDE,ZESTORETIC) 20-25 MG tablet TAKE 1/2 TABLET BY MOUTH EVERY 24 HOIURS FOR HYPERTENSION 10/21/17   [provider]  MILK THISTLE PO Take by mouth.    [provider]  Multiple Vitamin (MULTIVITAMIN) tablet Take by mouth.    [provider]  Omega-3 1000 MG CAPS Take by mouth.    [provider]  predniSONE (DELTASONE) 10 MG tablet  06/20/13   [provider]  tamsulosin (FLOMAX) 0.4 MG CAPS capsule  11/19/17   [provider]     Family History  Problem Relation Age of Onset   Heart disease Unknown        No family history   Hypertension  Mother        Was also a smoker   Hypertension Father    Heart disease Brother        Is a patient of Dr. Olga Millers    Social History   Socioeconomic History   Marital status: Significant Other    Spouse name: Not on file   Number of children: 2   Years of education: Not on file   Highest education level: Not on file  Occupational History   Occupation: Auto Body work    Comment: [employer]  Tobacco Use   Smoking  status: Every Day    Current packs/day: 0.50    Average packs/day: 0.5 packs/day for 30.0 years (15.0 ttl pk-yrs)    Types: Cigarettes   Smokeless tobacco: Never   Tobacco comments:    Has cut down from 1 pack/day  Substance and Sexual Activity   Alcohol use: Yes    Comment: 6-12 beers per night   Drug use: No   Sexual activity: Not on file  Other Topics Concern   Not on file  Social History Narrative   [name] now lives here in [place] with his brother Pitkin.  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  Currently working for [employer].   He indicates that he drinks anywhere from 6-12 beers a night.   He is separated from his wife who now lives in [place].   Social Drivers of Corporate investment banker Strain: Not on file  Food Insecurity: Not on file  Transportation Needs: Not on file  Physical Activity: Not on file  Stress: Not on file  Social Connections: Not on file     Review of Systems: A 12 point ROS discussed and pertinent positives are indicated in the HPI above.  All other systems are negative.  Vital Signs: There were no vitals taken for this visit.  Advance Care Plan: The advanced care place/surrogate decision maker was discussed at the time of visit and the patient did not wish to discuss or was not able to name a surrogate decision maker or provide an advance care plan.  Imaging: No results found.  Labs:  CBC: No results for input(s): "WBC", "HGB", "HCT", "PLT" in the last 8760 hours.  COAGS: No results for input(s): "INR", "APTT" in the last 8760 hours.  BMP: No results for input(s): "NA", "K", "CL", "CO2", "GLUCOSE", "BUN", "CALCIUM", "CREATININE", "GFRNONAA", "GFRAA" in the last 8760 hours.  Invalid input(s): "CMP"  LIVER FUNCTION TESTS: No results for input(s): "BILITOT", "AST", "ALT", "ALKPHOS", "PROT", "ALBUMIN" in the last 8760 hours.  TUMOR MARKERS: No results for input(s): "AFPTM", "CEA", "CA199",  "CHROMGRNA" in the last 8760 hours.  Assessment and Plan:  Pt has urinary retention, urethral stricture, and hydronephrosis.  Pt was scheduled today 11/20/23 for image guided suprapubic catheter placement.   Pt reported he took his aspirin this AM - will be rescheduled.    Thank you for allowing our service to participate in Earl Campbell 's care.  Electronically Signed: Loman Brooklyn, PA-C   11/20/2023, 8:36 AM

## 2023-12-26 ENCOUNTER — Other Ambulatory Visit (HOSPITAL_COMMUNITY): Payer: No Typology Code available for payment source

## 2024-01-06 DIAGNOSIS — N189 Chronic kidney disease, unspecified: Secondary | ICD-10-CM

## 2024-01-06 HISTORY — DX: Chronic kidney disease, unspecified: N18.9

## 2024-01-09 HISTORY — PX: CYSTOSCOPY WITH URETHRAL DILATATION: SHX5125

## 2024-01-30 ENCOUNTER — Other Ambulatory Visit: Payer: Self-pay | Admitting: Neurosurgery

## 2024-02-02 ENCOUNTER — Encounter (HOSPITAL_COMMUNITY): Payer: Self-pay

## 2024-02-02 NOTE — Progress Notes (Signed)
 PCP - Dr Devere Flurry Cardiologist - Dr Addie Holstein (Last OV 11/24/17, seen only once)  Chest x-ray - n/a EKG - 02/03/24 Stress Test - 07/29/07 ECHO - 12/10/17 Cardiac Cath - 04/07/08  ICD Pacemaker/Loop - n/a  Sleep Study -  n/a  Diabetes - n/a  Blood Thinner Instructions:  n/a  Aspirin Instructions: Last dose of Aspirin was on 01/30/24.  NPO   Anesthesia review: Yes  STOP now taking any Aspirin (unless otherwise instructed by your surgeon), Aleve, Naproxen, Ibuprofen, Motrin, Advil, Goody's, BC's, all herbal medications, fish oil, and all vitamins.   Coronavirus Screening Do you have any of the following symptoms:  Cough yes/no: No Fever (>100.53F)  yes/no: No Runny nose yes/no: No Sore throat yes/no: No Difficulty breathing/shortness of breath  yes/no: No  Have you traveled in the last 14 days and where? yes/no: No  Patient verbalized understanding of instructions that were given to them at the PAT appointment. Patient was also instructed that they will need to review over the PAT instructions again at home before surgery.

## 2024-02-02 NOTE — Progress Notes (Signed)
 Surgical Instructions   Your procedure is scheduled on Thursday, Feb 05, 2024. Report to [facility] Main Entrance "A" at 8:40 A.M., then check in with the Admitting office. Any questions or running late day of surgery: call [phone]  Questions prior to your surgery date: call [phone], Monday-Friday, 8am-4pm. If you experience any cold or flu symptoms such as cough, fever, chills, shortness of breath, etc. between now and your scheduled surgery, please notify us  at the above number.     Remember:  Do not eat or drink after midnight the night before your surgery   Take these medicines the morning of surgery with A SIP OF WATER : Diltiazem (Cardizem CD)   May take these medicines IF NEEDED: Hydrocodone -Acetaminophen  (Norco)  Follow your surgeon's instructions when to STOP Aspirin.  If no instructions were given by your surgeon, then you will need to call the office to obtain instructions.   One week prior to surgery, STOP taking any Aleve, Naproxen, Ibuprofen, Motrin, Advil, Goody's, BC's, all herbal medications, fish oil, and non-prescription vitamins.                     Do NOT Smoke (Tobacco/Vaping) for 24 hours prior to your procedure.  If you use a CPAP at night, you may bring your mask/headgear for your overnight stay.   You will be asked to remove any contacts, glasses, piercing's, hearing aid's, dentures/partials prior to surgery. Please bring cases for these items if needed.    Patients discharged the day of surgery will not be allowed to drive home, and someone needs to stay with them for 24 hours.  SURGICAL WAITING ROOM VISITATION Patients may have no more than 2 support people in the waiting area - these visitors may rotate.   Pre-op nurse will coordinate an appropriate time for 1 ADULT support person, who may not rotate, to accompany patient in pre-op.  Children under the age of 62 must have an adult with them who is not the patient and must remain in the main  waiting area with an adult.  If the patient needs to stay at the hospital during part of their recovery, the visitor guidelines for inpatient rooms apply.  Please refer to the [facility] website for the visitor guidelines for any additional information.   If you received a COVID test during your pre-op visit  it is requested that you wear a mask when out in public, stay away from anyone that may not be feeling well and notify your surgeon if you develop symptoms. If you have been in contact with anyone that has tested positive in the last 10 days please notify you surgeon.      Pre-operative 5 CHG Bathing Instructions   You can play a key role in reducing the risk of infection after surgery. Your skin needs to be as free of germs as possible. You can reduce the number of germs on your skin by washing with CHG (chlorhexidine gluconate) soap before surgery. CHG is an antiseptic soap that kills germs and continues to kill germs even after washing.   DO NOT use if you have an allergy to chlorhexidine/CHG or antibacterial soaps. If your skin becomes reddened or irritated, stop using the CHG and notify one of our RNs at [phone].   Please shower with the CHG soap starting 4 days before surgery using the following schedule:     Please keep in mind the following:  DO NOT shave, including legs and underarms,  starting the day of your first shower.   You may shave your face at any point before/day of surgery.  Place clean sheets on your bed the day you start using CHG soap. Use a clean washcloth (not used since being washed) for each shower. DO NOT sleep with pets once you start using the CHG.   CHG Shower Instructions:  Wash your face and private area with normal soap. If you choose to wash your hair, wash first with your normal shampoo.  After you use shampoo/soap, rinse your hair and body thoroughly to remove shampoo/soap residue.  Turn the water OFF and apply about 3 tablespoons (45 ml)  of CHG soap to a CLEAN washcloth.  Apply CHG soap ONLY FROM YOUR NECK DOWN TO YOUR TOES (washing for 3-5 minutes)  DO NOT use CHG soap on face, private areas, open wounds, or sores.  Pay special attention to the area where your surgery is being performed.  If you are having back surgery, having someone wash your back for you may be helpful. Wait 2 minutes after CHG soap is applied, then you may rinse off the CHG soap.  Pat dry with a clean towel  Put on clean clothes/pajamas   If you choose to wear lotion, please use ONLY the CHG-compatible lotions that are listed below.  Additional instructions for the day of surgery: DO NOT APPLY any lotions, deodorants, cologne, or perfumes.   Do not bring valuables to the hospital. [facility] is not responsible for any belongings/valuables. Do not wear nail polish, gel polish, artificial nails, or any other type of covering on natural nails (fingers and toes) Do not wear jewelry or makeup Put on clean/comfortable clothes.  Please brush your teeth.  Ask your nurse before applying any prescription medications to the skin.     CHG Compatible Lotions   Aveeno Moisturizing lotion  Cetaphil Moisturizing Cream  Cetaphil Moisturizing Lotion  Clairol Herbal Essence Moisturizing Lotion, Dry Skin  Clairol Herbal Essence Moisturizing Lotion, Extra Dry Skin  Clairol Herbal Essence Moisturizing Lotion, Normal Skin  Curel Age Defying Therapeutic Moisturizing Lotion with Alpha Hydroxy  Curel Extreme Care Body Lotion  Curel Soothing Hands Moisturizing Hand Lotion  Curel Therapeutic Moisturizing Cream, Fragrance-Free  Curel Therapeutic Moisturizing Lotion, Fragrance-Free  Curel Therapeutic Moisturizing Lotion, Original Formula  Eucerin Daily Replenishing Lotion  Eucerin Dry Skin Therapy Plus Alpha Hydroxy Crme  Eucerin Dry Skin Therapy Plus Alpha Hydroxy Lotion  Eucerin Original Crme  Eucerin Original Lotion  Eucerin Plus Crme Eucerin Plus Lotion   Eucerin TriLipid Replenishing Lotion  Keri Anti-Bacterial Hand Lotion  Keri Deep Conditioning Original Lotion Dry Skin Formula Softly Scented  Keri Deep Conditioning Original Lotion, Fragrance Free Sensitive Skin Formula  Keri Lotion Fast Absorbing Fragrance Free Sensitive Skin Formula  Keri Lotion Fast Absorbing Softly Scented Dry Skin Formula  Keri Original Lotion  Keri Skin Renewal Lotion Keri Silky Smooth Lotion  Keri Silky Smooth Sensitive Skin Lotion  Nivea Body Creamy Conditioning Oil  Nivea Body Extra Enriched Lotion  Nivea Body Original Lotion  Nivea Body Sheer Moisturizing Lotion Nivea Crme  Nivea Skin Firming Lotion  NutraDerm 30 Skin Lotion  NutraDerm Skin Lotion  NutraDerm Therapeutic Skin Cream  NutraDerm Therapeutic Skin Lotion  ProShield Protective Hand Cream  Provon moisturizing lotion  Please read over the following fact sheets that you were given.

## 2024-02-03 ENCOUNTER — Encounter (HOSPITAL_COMMUNITY)
Admission: RE | Admit: 2024-02-03 | Discharge: 2024-02-03 | Disposition: A | Discharge status: Home / Self Care | Source: Ambulatory Visit | Attending: Neurosurgery | Admitting: Neurosurgery

## 2024-02-03 ENCOUNTER — Other Ambulatory Visit: Payer: Self-pay

## 2024-02-03 ENCOUNTER — Encounter (HOSPITAL_COMMUNITY): Payer: Self-pay

## 2024-02-03 VITALS — BP 175/80 | HR 72 | Temp 97.7°F | Resp 18 | Ht 70.0 in | Wt 144.8 lb

## 2024-02-03 DIAGNOSIS — Z955 Presence of coronary angioplasty implant and graft: Secondary | ICD-10-CM | POA: Insufficient documentation

## 2024-02-03 DIAGNOSIS — F17200 Nicotine dependence, unspecified, uncomplicated: Secondary | ICD-10-CM | POA: Insufficient documentation

## 2024-02-03 DIAGNOSIS — D696 Thrombocytopenia, unspecified: Secondary | ICD-10-CM | POA: Insufficient documentation

## 2024-02-03 DIAGNOSIS — N183 Chronic kidney disease, stage 3 unspecified: Secondary | ICD-10-CM | POA: Insufficient documentation

## 2024-02-03 DIAGNOSIS — J449 Chronic obstructive pulmonary disease, unspecified: Secondary | ICD-10-CM | POA: Insufficient documentation

## 2024-02-03 DIAGNOSIS — E871 Hypo-osmolality and hyponatremia: Secondary | ICD-10-CM | POA: Insufficient documentation

## 2024-02-03 DIAGNOSIS — D509 Iron deficiency anemia, unspecified: Secondary | ICD-10-CM | POA: Insufficient documentation

## 2024-02-03 DIAGNOSIS — I129 Hypertensive chronic kidney disease with stage 1 through stage 4 chronic kidney disease, or unspecified chronic kidney disease: Secondary | ICD-10-CM | POA: Insufficient documentation

## 2024-02-03 DIAGNOSIS — Z01818 Encounter for other preprocedural examination: Secondary | ICD-10-CM | POA: Insufficient documentation

## 2024-02-03 DIAGNOSIS — I2510 Atherosclerotic heart disease of native coronary artery without angina pectoris: Secondary | ICD-10-CM | POA: Insufficient documentation

## 2024-02-03 DIAGNOSIS — E785 Hyperlipidemia, unspecified: Secondary | ICD-10-CM | POA: Insufficient documentation

## 2024-02-03 HISTORY — DX: Headache, unspecified: R51.9

## 2024-02-03 HISTORY — DX: Acute myocardial infarction, unspecified: I21.9

## 2024-02-03 HISTORY — DX: Anemia, unspecified: D64.9

## 2024-02-03 LAB — CBC
HCT: 34.6 % — ABNORMAL LOW (ref 39.0–52.0)
Hemoglobin: 12.1 g/dL — ABNORMAL LOW (ref 13.0–17.0)
MCH: 31.5 pg (ref 26.0–34.0)
MCHC: 35 g/dL (ref 30.0–36.0)
MCV: 90.1 fL (ref 80.0–100.0)
Platelets: 117 10*3/uL — ABNORMAL LOW (ref 150–400)
RBC: 3.84 MIL/uL — ABNORMAL LOW (ref 4.22–5.81)
RDW: 14.1 % (ref 11.5–15.5)
WBC: 5.1 10*3/uL (ref 4.0–10.5)
nRBC: 0 % (ref 0.0–0.2)

## 2024-02-03 LAB — COMPREHENSIVE METABOLIC PANEL WITH GFR
ALT: 40 U/L (ref 0–44)
AST: 63 U/L — ABNORMAL HIGH (ref 15–41)
Albumin: 3.5 g/dL (ref 3.5–5.0)
Alkaline Phosphatase: 119 U/L (ref 38–126)
Anion gap: 9 (ref 5–15)
BUN: 27 mg/dL — ABNORMAL HIGH (ref 8–23)
CO2: 18 mmol/L — ABNORMAL LOW (ref 22–32)
Calcium: 9.1 mg/dL (ref 8.9–10.3)
Chloride: 105 mmol/L (ref 98–111)
Creatinine, Ser: 1.88 mg/dL — ABNORMAL HIGH (ref 0.61–1.24)
GFR, Estimated: 39 mL/min — ABNORMAL LOW (ref >60)
Glucose, Bld: 123 mg/dL — ABNORMAL HIGH (ref 70–99)
Potassium: 4 mmol/L (ref 3.5–5.1)
Sodium: 132 mmol/L — ABNORMAL LOW (ref 135–145)
Total Bilirubin: 0.9 mg/dL (ref 0.0–1.2)
Total Protein: 7.4 g/dL (ref 6.5–8.1)

## 2024-02-03 LAB — TYPE AND SCREEN
ABO/RH(D): O NEG
Antibody Screen: NEGATIVE

## 2024-02-03 LAB — SURGICAL PCR SCREEN
MRSA, PCR: NEGATIVE
Staphylococcus aureus: NEGATIVE

## 2024-02-04 NOTE — Anesthesia Preprocedure Evaluation (Addendum)
 Anesthesia Evaluation  Patient identified by MRN, date of birth, ID band Patient awake    Reviewed: Allergy & Precautions, NPO status , Patient's Chart, lab work & pertinent test results, reviewed documented beta blocker date and time   History of Anesthesia Complications Negative for: history of anesthetic complications  Airway Mallampati: II  TM Distance: >3 FB Neck ROM: Limited    Dental  (+) Edentulous Upper, Edentulous Lower   Pulmonary neg shortness of breath, COPD, neg recent URI, Current Smoker and Patient abstained from smoking.   breath sounds clear to auscultation       Cardiovascular hypertension, + CAD, + Past MI and + Cardiac Stents  (-) CABG, (-) Peripheral Vascular Disease, (-) CHF, (-) Orthopnea and (-) DVT  Rhythm:Regular Rate:Normal     Neuro/Psych  Headaches, neg Seizures PSYCHIATRIC DISORDERS Anxiety        GI/Hepatic ,neg GERD  ,,(+) Cirrhosis       , Hepatitis -  Endo/Other    Renal/GU CRFRenal disease     Musculoskeletal   Abdominal   Peds  Hematology  (+) Blood dyscrasia, anemia   Anesthesia Other Findings   Reproductive/Obstetrics                             Anesthesia Physical Anesthesia Plan  ASA: 3  Anesthesia Plan: General   Post-op Pain Management:    Induction: Intravenous  PONV Risk Score and Plan: 2 and Ondansetron  and Dexamethasone   Airway Management Planned: Oral ETT and Fiberoptic Intubation Planned  Additional Equipment: Arterial line  Intra-op Plan:   Post-operative Plan: Extubation in OR  Informed Consent: I have reviewed the patients History and Physical, chart, labs and discussed the procedure including the risks, benefits and alternatives for the proposed anesthesia with the patient or authorized representative who has indicated his/her understanding and acceptance.     Dental advisory given  Plan Discussed with:  CRNA  Anesthesia Plan Comments: (PAT note by Rudy Costain, PA-C: 63 yo male with pertinent hx including HTN, HLD, current smoker with associated COPD, CKD 3 followed by nephrology, EtOH abuse, hepatitis C, iron  deficiency anemia, CAD s/p DES to single-vessel disease in the LAD in 2008 (patent on relook cath 2009).  Last seen by cardiologist Dr. Myrlene Asper 11/24/2017 for preop evaluation prior to undergoing urologic surgery.  Echo at that time showed EF 60 to 65%, normal wall motion, mildly dilated ascending aorta (40 mm).  Recently underwent cystourethroscopy with dilation of urethral stricture on 01/09/2024 at [facility] without complication.  Preop labs reviewed, AST mildly elevated at 63, mild hyponatremia with sodium 132, creatinine elevated 1.88 (most recent comparison from PCP office showed creatinine 1.86 on 11/14/2023), mild anemia with hemoglobin 12.1, mild thrombocytopenia with platelets 117.  Per Dr. Andy Bannister, case was added on urgently and needs to proceed. CT cervical spine 10/29/22 showed chornic type 2 dens fracture with 7-49mm retrolisthesis at C1-2.   EKG 02/03/2024: NSR.  Rate 67.  TTE 12/10/2017: - Left ventricle: The cavity size was normal. Systolic function was  normal. The estimated ejection fraction was in the range of 60%  to 65%. Wall motion was normal; there were no regional wall  motion abnormalities. Left ventricular diastolic function  parameters were normal.  - Aorta: Ascending aortic diameter: 40 mm (S).  - Ascending aorta: The ascending aorta was mildly dilated.  - Pulmonary arteries: The main pulmonary artery was normal-sized.   )  Anesthesia Quick Evaluation

## 2024-02-04 NOTE — Progress Notes (Addendum)
 Anesthesia Chart Review:  63 yo male with pertinent hx including HTN, HLD, current smoker with associated COPD, CKD 3 followed by nephrology, EtOH abuse, hepatitis C, iron deficiency anemia, CAD s/p DES to single-vessel disease in the LAD in 2008 (patent on relook cath 2009).  Last seen by cardiologist Dr. Myrlene Asper 11/24/2017 for preop evaluation prior to undergoing urologic surgery.  Echo at that time showed EF 60 to 65%, normal wall motion, mildly dilated ascending aorta (40 mm).  Recently underwent cystourethroscopy with dilation of urethral stricture on 01/09/2024 at [facility] without complication.  Preop labs reviewed, AST mildly elevated at 63, mild hyponatremia with sodium 132, creatinine elevated 1.88 (most recent comparison from PCP office showed creatinine 1.86 on 11/14/2023), mild anemia with hemoglobin 12.1, mild thrombocytopenia with platelets 117.  Per Dr. Andy Bannister, case was added on urgently and needs to proceed. CT cervical spine 10/29/22 showed chornic type 2 dens fracture with 7-25mm retrolisthesis at C1-2.   EKG 02/03/2024: NSR.  Rate 67.  TTE 12/10/2017: - Left ventricle: The cavity size was normal. Systolic function was    normal. The estimated ejection fraction was in the range of 60%    to 65%. Wall motion was normal; there were no regional wall    motion abnormalities. Left ventricular diastolic function    parameters were normal.  - Aorta: Ascending aortic diameter: 40 mm (S).  - Ascending aorta: The ascending aorta was mildly dilated.  - Pulmonary arteries: The main pulmonary artery was normal-sized.       Edilia Gordon [facility]/Anesthesiology Phone [phone] 02/04/2024 9:59 AM

## 2024-02-05 ENCOUNTER — Inpatient Hospital Stay (HOSPITAL_COMMUNITY)
Admission: RE | Admit: 2024-02-05 | Discharge: 2024-02-09 | DRG: 472 | Disposition: A | Discharge status: Home Health | Attending: Neurosurgery | Admitting: Neurosurgery

## 2024-02-05 ENCOUNTER — Other Ambulatory Visit: Payer: Self-pay

## 2024-02-05 ENCOUNTER — Inpatient Hospital Stay (HOSPITAL_COMMUNITY)

## 2024-02-05 ENCOUNTER — Inpatient Hospital Stay (HOSPITAL_COMMUNITY): Payer: Self-pay | Admitting: Physician Assistant

## 2024-02-05 ENCOUNTER — Encounter (HOSPITAL_COMMUNITY)
Admission: RE | Disposition: A | Discharge status: Home / Self Care | Payer: Self-pay | Source: Home / Self Care | Attending: Neurosurgery

## 2024-02-05 ENCOUNTER — Inpatient Hospital Stay (HOSPITAL_COMMUNITY): Admitting: Anesthesiology

## 2024-02-05 DIAGNOSIS — S12100A Unspecified displaced fracture of second cervical vertebra, initial encounter for closed fracture: Principal | ICD-10-CM | POA: Diagnosis present

## 2024-02-05 DIAGNOSIS — F17210 Nicotine dependence, cigarettes, uncomplicated: Secondary | ICD-10-CM | POA: Diagnosis present

## 2024-02-05 DIAGNOSIS — I2510 Atherosclerotic heart disease of native coronary artery without angina pectoris: Secondary | ICD-10-CM

## 2024-02-05 DIAGNOSIS — N1832 Chronic kidney disease, stage 3b: Secondary | ICD-10-CM | POA: Diagnosis present

## 2024-02-05 DIAGNOSIS — R339 Retention of urine, unspecified: Secondary | ICD-10-CM | POA: Diagnosis not present

## 2024-02-05 DIAGNOSIS — M5481 Occipital neuralgia: Secondary | ICD-10-CM | POA: Diagnosis present

## 2024-02-05 DIAGNOSIS — M4801 Spinal stenosis, occipito-atlanto-axial region: Secondary | ICD-10-CM | POA: Diagnosis present

## 2024-02-05 DIAGNOSIS — S12111A Posterior displaced Type II dens fracture, initial encounter for closed fracture: Principal | ICD-10-CM | POA: Diagnosis present

## 2024-02-05 DIAGNOSIS — J449 Chronic obstructive pulmonary disease, unspecified: Secondary | ICD-10-CM | POA: Diagnosis present

## 2024-02-05 DIAGNOSIS — B1920 Unspecified viral hepatitis C without hepatic coma: Secondary | ICD-10-CM | POA: Diagnosis present

## 2024-02-05 DIAGNOSIS — F10139 Alcohol abuse with withdrawal, unspecified: Secondary | ICD-10-CM | POA: Diagnosis not present

## 2024-02-05 DIAGNOSIS — Z79899 Other long term (current) drug therapy: Secondary | ICD-10-CM | POA: Diagnosis not present

## 2024-02-05 DIAGNOSIS — Z955 Presence of coronary angioplasty implant and graft: Secondary | ICD-10-CM

## 2024-02-05 DIAGNOSIS — S12111D Posterior displaced Type II dens fracture, subsequent encounter for fracture with routine healing: Secondary | ICD-10-CM | POA: Diagnosis present

## 2024-02-05 DIAGNOSIS — I252 Old myocardial infarction: Secondary | ICD-10-CM

## 2024-02-05 DIAGNOSIS — F10930 Alcohol use, unspecified with withdrawal, uncomplicated: Secondary | ICD-10-CM | POA: Diagnosis not present

## 2024-02-05 DIAGNOSIS — G992 Myelopathy in diseases classified elsewhere: Secondary | ICD-10-CM | POA: Diagnosis present

## 2024-02-05 DIAGNOSIS — Z7982 Long term (current) use of aspirin: Secondary | ICD-10-CM | POA: Diagnosis not present

## 2024-02-05 DIAGNOSIS — I129 Hypertensive chronic kidney disease with stage 1 through stage 4 chronic kidney disease, or unspecified chronic kidney disease: Secondary | ICD-10-CM | POA: Diagnosis present

## 2024-02-05 DIAGNOSIS — E785 Hyperlipidemia, unspecified: Secondary | ICD-10-CM | POA: Diagnosis present

## 2024-02-05 DIAGNOSIS — R338 Other retention of urine: Secondary | ICD-10-CM | POA: Diagnosis not present

## 2024-02-05 DIAGNOSIS — Z8249 Family history of ischemic heart disease and other diseases of the circulatory system: Secondary | ICD-10-CM

## 2024-02-05 DIAGNOSIS — S12191A Other nondisplaced fracture of second cervical vertebra, initial encounter for closed fracture: Secondary | ICD-10-CM | POA: Diagnosis not present

## 2024-02-05 HISTORY — PX: POSTERIOR CERVICAL FUSION/FORAMINOTOMY: SHX5038

## 2024-02-05 LAB — ABO/RH: ABO/RH(D): O NEG

## 2024-02-05 SURGERY — POSTERIOR CERVICAL FUSION/FORAMINOTOMY LEVEL 4
Anesthesia: General | Site: Spine Cervical

## 2024-02-05 MED ORDER — DEXAMETHASONE SODIUM PHOSPHATE 10 MG/ML IJ SOLN
INTRAMUSCULAR | Status: DC | PRN
  Administered 2024-02-05: 10 mg via INTRAVENOUS

## 2024-02-05 MED ORDER — HYDROMORPHONE HCL 1 MG/ML IJ SOLN
INTRAMUSCULAR | Status: AC
  Filled 2024-02-05: qty 1

## 2024-02-05 MED ORDER — OXYCODONE HCL 5 MG PO TABS: 5.0000 mg | ORAL_TABLET | Freq: Once | ORAL | Status: DC | PRN

## 2024-02-05 MED ORDER — ONDANSETRON HCL 4 MG/2ML IJ SOLN: 4.0000 mg | Freq: Once | INTRAMUSCULAR | Status: DC | PRN

## 2024-02-05 MED ORDER — DEXMEDETOMIDINE HCL IN NACL 80 MCG/20ML IV SOLN
INTRAVENOUS | Status: DC | PRN
  Administered 2024-02-05: 8 ug via INTRAVENOUS

## 2024-02-05 MED ORDER — LIDOCAINE 2% (20 MG/ML) 5 ML SYRINGE
INTRAMUSCULAR | Status: AC
  Filled 2024-02-05: qty 5

## 2024-02-05 MED ORDER — CEFAZOLIN SODIUM-DEXTROSE 2-4 GM/100ML-% IV SOLN
2.0000 g | INTRAVENOUS | Status: AC
  Administered 2024-02-05: 2 g via INTRAVENOUS
  Filled 2024-02-05: qty 100

## 2024-02-05 MED ORDER — ONDANSETRON HCL 4 MG/2ML IJ SOLN: 4.0000 mg | Freq: Four times a day (QID) | INTRAMUSCULAR | Status: DC | PRN

## 2024-02-05 MED ORDER — PHENOL 1.4 % MT LIQD: 1.0000 | OROMUCOSAL | Status: DC | PRN

## 2024-02-05 MED ORDER — DEXAMETHASONE SODIUM PHOSPHATE 10 MG/ML IJ SOLN
INTRAMUSCULAR | Status: AC
  Filled 2024-02-05: qty 1

## 2024-02-05 MED ORDER — BUPIVACAINE LIPOSOME 1.3 % IJ SUSP
INTRAMUSCULAR | Status: AC
  Filled 2024-02-05: qty 20

## 2024-02-05 MED ORDER — LACTATED RINGERS IV SOLN
INTRAVENOUS | Status: DC
Start: 2024-02-05 — End: 2024-02-05

## 2024-02-05 MED ORDER — BUPIVACAINE LIPOSOME 1.3 % IJ SUSP
INTRAMUSCULAR | Status: DC | PRN
  Administered 2024-02-05: 20 mL

## 2024-02-05 MED ORDER — THIAMINE HCL 100 MG/ML IJ SOLN: 100.0000 mg | Freq: Every day | INTRAMUSCULAR | Status: DC

## 2024-02-05 MED ORDER — MIDAZOLAM HCL 2 MG/2ML IJ SOLN
INTRAMUSCULAR | Status: AC
  Filled 2024-02-05: qty 2

## 2024-02-05 MED ORDER — BUPIVACAINE HCL (PF) 0.5 % IJ SOLN
INTRAMUSCULAR | Status: DC | PRN
  Administered 2024-02-05: 18 mL
  Administered 2024-02-05: 4 mL

## 2024-02-05 MED ORDER — ADULT MULTIVITAMIN W/MINERALS CH
1.0000 | ORAL_TABLET | Freq: Every day | ORAL | Status: DC
  Administered 2024-02-05 – 2024-02-09 (×5): 1 via ORAL
  Filled 2024-02-05 (×5): qty 1

## 2024-02-05 MED ORDER — SODIUM CHLORIDE 0.9 % IV SOLN
0.1500 ug/kg/min | INTRAVENOUS | Status: DC
  Filled 2024-02-05: qty 2000

## 2024-02-05 MED ORDER — ACETAMINOPHEN 10 MG/ML IV SOLN
INTRAVENOUS | Status: AC
  Filled 2024-02-05: qty 100

## 2024-02-05 MED ORDER — SUCCINYLCHOLINE CHLORIDE 200 MG/10ML IV SOSY
PREFILLED_SYRINGE | INTRAVENOUS | Status: AC
  Filled 2024-02-05: qty 10

## 2024-02-05 MED ORDER — PROPOFOL 500 MG/50ML IV EMUL
INTRAVENOUS | Status: DC | PRN
  Administered 2024-02-05: 125 ug/kg/min via INTRAVENOUS

## 2024-02-05 MED ORDER — DOCUSATE SODIUM 100 MG PO CAPS
100.0000 mg | ORAL_CAPSULE | Freq: Two times a day (BID) | ORAL | Status: DC
  Administered 2024-02-05 – 2024-02-09 (×8): 100 mg via ORAL
  Filled 2024-02-05 (×8): qty 1

## 2024-02-05 MED ORDER — FERROUS GLUCONATE 324 (38 FE) MG PO TABS
324.0000 mg | ORAL_TABLET | Freq: Every morning | ORAL | Status: DC
  Administered 2024-02-06 – 2024-02-09 (×4): 324 mg via ORAL
  Filled 2024-02-05 (×5): qty 1

## 2024-02-05 MED ORDER — LIDOCAINE-EPINEPHRINE 1 %-1:100000 IJ SOLN
INTRAMUSCULAR | Status: AC
  Filled 2024-02-05: qty 1

## 2024-02-05 MED ORDER — FENTANYL CITRATE (PF) 250 MCG/5ML IJ SOLN
INTRAMUSCULAR | Status: DC | PRN
  Administered 2024-02-05: 50 ug via INTRAVENOUS
  Administered 2024-02-05 (×2): 100 ug via INTRAVENOUS
  Administered 2024-02-05: 50 ug via INTRAVENOUS

## 2024-02-05 MED ORDER — VANCOMYCIN HCL 1 G IV SOLR
INTRAVENOUS | Status: DC | PRN
  Administered 2024-02-05: 1000 mg via TOPICAL

## 2024-02-05 MED ORDER — FENTANYL CITRATE (PF) 100 MCG/2ML IJ SOLN: 25.0000 ug | INTRAMUSCULAR | Status: DC | PRN

## 2024-02-05 MED ORDER — BUPIVACAINE HCL (PF) 0.5 % IJ SOLN
INTRAMUSCULAR | Status: AC
  Filled 2024-02-05: qty 30

## 2024-02-05 MED ORDER — SODIUM CHLORIDE 0.9 % IV SOLN
250.0000 mL | INTRAVENOUS | Status: AC
  Administered 2024-02-05: 250 mL via INTRAVENOUS

## 2024-02-05 MED ORDER — LACTATED RINGERS IV SOLN: INTRAVENOUS | Status: DC | PRN

## 2024-02-05 MED ORDER — CHLORHEXIDINE GLUCONATE 0.12 % MT SOLN
15.0000 mL | Freq: Once | OROMUCOSAL | Status: AC
  Administered 2024-02-05: 15 mL via OROMUCOSAL
  Filled 2024-02-05: qty 15

## 2024-02-05 MED ORDER — MENTHOL 3 MG MT LOZG: 1.0000 | LOZENGE | OROMUCOSAL | Status: DC | PRN

## 2024-02-05 MED ORDER — HYDROMORPHONE HCL 1 MG/ML IJ SOLN
INTRAMUSCULAR | Status: DC | PRN
  Administered 2024-02-05: 1 mg via INTRAVENOUS

## 2024-02-05 MED ORDER — HYDROMORPHONE HCL 1 MG/ML IJ SOLN: 0.5000 mg | INTRAMUSCULAR | Status: DC | PRN

## 2024-02-05 MED ORDER — THIAMINE MONONITRATE 100 MG PO TABS
100.0000 mg | ORAL_TABLET | Freq: Every day | ORAL | Status: DC
  Administered 2024-02-05 – 2024-02-09 (×5): 100 mg via ORAL
  Filled 2024-02-05 (×5): qty 1

## 2024-02-05 MED ORDER — ACETAMINOPHEN 10 MG/ML IV SOLN
1000.0000 mg | Freq: Once | INTRAVENOUS | Status: DC | PRN
  Administered 2024-02-05: 1000 mg via INTRAVENOUS

## 2024-02-05 MED ORDER — LIDOCAINE-EPINEPHRINE 1 %-1:100000 IJ SOLN
INTRAMUSCULAR | Status: DC | PRN
  Administered 2024-02-05: 4 mL

## 2024-02-05 MED ORDER — MIDAZOLAM HCL 2 MG/2ML IJ SOLN
INTRAMUSCULAR | Status: DC | PRN
  Administered 2024-02-05: 2 mg via INTRAVENOUS

## 2024-02-05 MED ORDER — THROMBIN 5000 UNITS EX SOLR
OROMUCOSAL | Status: DC | PRN
  Administered 2024-02-05: 5 mL via TOPICAL

## 2024-02-05 MED ORDER — CHLORHEXIDINE GLUCONATE CLOTH 2 % EX PADS: 6.0000 | MEDICATED_PAD | Freq: Once | CUTANEOUS | Status: DC

## 2024-02-05 MED ORDER — OXYCODONE HCL 5 MG PO TABS: 5.0000 mg | ORAL_TABLET | ORAL | Status: DC | PRN

## 2024-02-05 MED ORDER — SODIUM CHLORIDE 0.9% FLUSH: 3.0000 mL | INTRAVENOUS | Status: DC | PRN

## 2024-02-05 MED ORDER — VITAMIN D 25 MCG (1000 UNIT) PO TABS
1000.0000 [IU] | ORAL_TABLET | Freq: Every morning | ORAL | Status: DC
  Administered 2024-02-06 – 2024-02-09 (×4): 1000 [IU] via ORAL
  Filled 2024-02-05 (×4): qty 1

## 2024-02-05 MED ORDER — FENTANYL CITRATE (PF) 250 MCG/5ML IJ SOLN
INTRAMUSCULAR | Status: AC
  Filled 2024-02-05: qty 5

## 2024-02-05 MED ORDER — FENTANYL CITRATE (PF) 250 MCG/5ML IJ SOLN
INTRAMUSCULAR | Status: AC
Start: 2024-02-05 — End: ?
  Filled 2024-02-05: qty 5

## 2024-02-05 MED ORDER — SODIUM CHLORIDE 0.9 % IV SOLN
0.1500 ug/kg/min | INTRAVENOUS | Status: DC
  Administered 2024-02-05: 15.2 ug via INTRAVENOUS
  Filled 2024-02-05: qty 2000

## 2024-02-05 MED ORDER — SUCCINYLCHOLINE CHLORIDE 200 MG/10ML IV SOSY
PREFILLED_SYRINGE | INTRAVENOUS | Status: DC | PRN
  Administered 2024-02-05: 120 mg via INTRAVENOUS

## 2024-02-05 MED ORDER — VANCOMYCIN HCL 1000 MG IV SOLR
INTRAVENOUS | Status: AC
  Filled 2024-02-05: qty 20

## 2024-02-05 MED ORDER — HYDROMORPHONE HCL 1 MG/ML IJ SOLN
0.5000 mg | INTRAMUSCULAR | Status: DC | PRN
  Administered 2024-02-05 – 2024-02-09 (×3): 0.5 mg via INTRAVENOUS
  Filled 2024-02-05 (×3): qty 0.5

## 2024-02-05 MED ORDER — CEFAZOLIN SODIUM-DEXTROSE 2-4 GM/100ML-% IV SOLN
2.0000 g | Freq: Four times a day (QID) | INTRAVENOUS | Status: AC
  Administered 2024-02-05 – 2024-02-06 (×2): 2 g via INTRAVENOUS
  Filled 2024-02-05 (×2): qty 100

## 2024-02-05 MED ORDER — ONDANSETRON HCL 4 MG/2ML IJ SOLN
INTRAMUSCULAR | Status: AC
  Filled 2024-02-05: qty 2

## 2024-02-05 MED ORDER — POLYETHYLENE GLYCOL 3350 17 G PO PACK
17.0000 g | PACK | Freq: Every day | ORAL | Status: DC | PRN
  Administered 2024-02-08: 17 g via ORAL
  Filled 2024-02-05: qty 1

## 2024-02-05 MED ORDER — PHENYLEPHRINE HCL-NACL 20-0.9 MG/250ML-% IV SOLN
INTRAVENOUS | Status: DC | PRN
  Administered 2024-02-05: 30 ug/min via INTRAVENOUS

## 2024-02-05 MED ORDER — THROMBIN 5000 UNITS EX KIT
PACK | CUTANEOUS | Status: AC
  Filled 2024-02-05: qty 1

## 2024-02-05 MED ORDER — ACETAMINOPHEN 650 MG RE SUPP: 650.0000 mg | RECTAL | Status: DC | PRN

## 2024-02-05 MED ORDER — LIDOCAINE 2% (20 MG/ML) 5 ML SYRINGE
INTRAMUSCULAR | Status: DC | PRN
  Administered 2024-02-05: 100 mg via INTRAVENOUS

## 2024-02-05 MED ORDER — PROPOFOL 10 MG/ML IV BOLUS
INTRAVENOUS | Status: DC | PRN
  Administered 2024-02-05: 120 mg via INTRAVENOUS
  Administered 2024-02-05: 80 mg via INTRAVENOUS
  Administered 2024-02-05 (×2): 50 mg via INTRAVENOUS

## 2024-02-05 MED ORDER — FOLIC ACID 1 MG PO TABS
1.0000 mg | ORAL_TABLET | Freq: Every day | ORAL | Status: DC
  Administered 2024-02-05 – 2024-02-09 (×5): 1 mg via ORAL
  Filled 2024-02-05 (×5): qty 1

## 2024-02-05 MED ORDER — ONDANSETRON HCL 4 MG/2ML IJ SOLN
INTRAMUSCULAR | Status: DC | PRN
  Administered 2024-02-05: 4 mg via INTRAVENOUS

## 2024-02-05 MED ORDER — FERROUS SULFATE 325 (65 FE) MG PO TBEC: 325.0000 mg | DELAYED_RELEASE_TABLET | Freq: Every morning | ORAL | Status: DC

## 2024-02-05 MED ORDER — METHOCARBAMOL 500 MG PO TABS
500.0000 mg | ORAL_TABLET | Freq: Four times a day (QID) | ORAL | Status: DC | PRN
  Administered 2024-02-07 – 2024-02-09 (×7): 500 mg via ORAL
  Filled 2024-02-05 (×7): qty 1

## 2024-02-05 MED ORDER — METHOCARBAMOL 1000 MG/10ML IJ SOLN: 500.0000 mg | Freq: Four times a day (QID) | INTRAMUSCULAR | Status: DC | PRN

## 2024-02-05 MED ORDER — SODIUM CHLORIDE 0.9% FLUSH
3.0000 mL | Freq: Two times a day (BID) | INTRAVENOUS | Status: DC
  Administered 2024-02-05 – 2024-02-09 (×8): 3 mL via INTRAVENOUS

## 2024-02-05 MED ORDER — OXYCODONE HCL 5 MG PO TABS
10.0000 mg | ORAL_TABLET | ORAL | Status: DC | PRN
  Administered 2024-02-06 – 2024-02-09 (×11): 10 mg via ORAL
  Filled 2024-02-05 (×11): qty 2

## 2024-02-05 MED ORDER — ACETAMINOPHEN 325 MG PO TABS
650.0000 mg | ORAL_TABLET | ORAL | Status: DC | PRN
  Administered 2024-02-07 – 2024-02-09 (×4): 650 mg via ORAL
  Filled 2024-02-05 (×4): qty 2

## 2024-02-05 MED ORDER — 0.9 % SODIUM CHLORIDE (POUR BTL) OPTIME
TOPICAL | Status: DC | PRN
  Administered 2024-02-05: 1000 mL

## 2024-02-05 MED ORDER — DILTIAZEM HCL ER COATED BEADS 180 MG PO CP24
180.0000 mg | ORAL_CAPSULE | Freq: Every morning | ORAL | Status: DC
  Administered 2024-02-06 – 2024-02-09 (×4): 180 mg via ORAL
  Filled 2024-02-05 (×4): qty 1

## 2024-02-05 MED ORDER — OXYCODONE HCL 5 MG/5ML PO SOLN: 5.0000 mg | Freq: Once | ORAL | Status: DC | PRN

## 2024-02-05 MED ORDER — ORAL CARE MOUTH RINSE: 15.0000 mL | Freq: Once | OROMUCOSAL | Status: AC

## 2024-02-05 MED ORDER — LORAZEPAM 2 MG/ML IJ SOLN
1.0000 mg | INTRAMUSCULAR | Status: AC | PRN
  Administered 2024-02-06: 1 mg via INTRAVENOUS
  Filled 2024-02-05: qty 1

## 2024-02-05 MED ORDER — FLEET ENEMA RE ENEM: 1.0000 | ENEMA | Freq: Once | RECTAL | Status: DC | PRN

## 2024-02-05 MED ORDER — CHLORHEXIDINE GLUCONATE CLOTH 2 % EX PADS
6.0000 | MEDICATED_PAD | Freq: Once | CUTANEOUS | Status: AC
  Administered 2024-02-05: 6 via TOPICAL

## 2024-02-05 MED ORDER — ONDANSETRON HCL 4 MG PO TABS: 4.0000 mg | ORAL_TABLET | Freq: Four times a day (QID) | ORAL | Status: DC | PRN

## 2024-02-05 MED ORDER — LORAZEPAM 1 MG PO TABS
1.0000 mg | ORAL_TABLET | ORAL | Status: AC | PRN
  Administered 2024-02-06 – 2024-02-07 (×2): 1 mg via ORAL
  Filled 2024-02-05 (×2): qty 1

## 2024-02-05 MED ORDER — SODIUM CHLORIDE 0.9 % IV SOLN
INTRAVENOUS | Status: DC | PRN
  Administered 2024-02-05: .1 ug/kg/min via INTRAVENOUS

## 2024-02-05 SURGICAL SUPPLY — 65 items
BAG COUNTER SPONGE SURGICOUNT (BAG) ×1 IMPLANT
BAND RUBBER #18 3X1/16 STRL (MISCELLANEOUS) ×2 IMPLANT
BIT DRILL 2.4 (BIT) IMPLANT
BIT DRILL NEURO 2X3.1 SFT TUCH (MISCELLANEOUS) ×1 IMPLANT
BIT DRILL WIRE PASS 1.3MM (BIT) IMPLANT
BLADE CLIPPER SURG (BLADE) IMPLANT
BUR MATCHSTICK NEURO 3.0 LAGG (BURR) ×1 IMPLANT
CANISTER SUCT 3000ML PPV (MISCELLANEOUS) ×1 IMPLANT
COLLAR CERV LO CONTOUR FIRM DE (SOFTGOODS) IMPLANT
DERMABOND ADVANCED .7 DNX12 (GAUZE/BANDAGES/DRESSINGS) ×1 IMPLANT
DRAIN JACKSON PRT FLT 7MM (DRAIN) IMPLANT
DRAPE C-ARM 42X72 X-RAY (DRAPES) ×2 IMPLANT
DRAPE LAPAROTOMY 100X72 PEDS (DRAPES) ×1 IMPLANT
DRAPE MICROSCOPE SLANT 54X150 (MISCELLANEOUS) IMPLANT
DRAPE SURG 17X23 STRL (DRAPES) ×1 IMPLANT
DRSG OPSITE POSTOP 4X6 (GAUZE/BANDAGES/DRESSINGS) ×1 IMPLANT
DURAPREP 26ML APPLICATOR (WOUND CARE) ×1 IMPLANT
ELECT COATED BLADE 2.86 ST (ELECTRODE) ×1 IMPLANT
ELECTRODE REM PT RTRN 9FT ADLT (ELECTROSURGICAL) ×1 IMPLANT
EVACUATOR SILICONE 100CC (DRAIN) IMPLANT
FEE INTRAOP CADWELL SUPPLY NCS (MISCELLANEOUS) IMPLANT
FEE INTRAOP MONITOR IMPULS NCS (MISCELLANEOUS) IMPLANT
GAUZE 4X4 16PLY ~~LOC~~+RFID DBL (SPONGE) IMPLANT
GLOVE BIO SURGEON STRL SZ7 (GLOVE) ×2 IMPLANT
GLOVE BIOGEL PI IND STRL 7.5 (GLOVE) ×3 IMPLANT
GLOVE ECLIPSE 7.5 STRL STRAW (GLOVE) ×1 IMPLANT
GLOVE EXAM NITRILE XL STR (GLOVE) IMPLANT
GOWN STRL REUS W/ TWL LRG LVL3 (GOWN DISPOSABLE) ×2 IMPLANT
GOWN STRL REUS W/ TWL XL LVL3 (GOWN DISPOSABLE) ×2 IMPLANT
GOWN STRL REUS W/TWL 2XL LVL3 (GOWN DISPOSABLE) IMPLANT
GRAFT BN 5X1XSPNE CVD POST DBM (Bone Implant) IMPLANT
HEMOSTAT POWDER KIT SURGIFOAM (HEMOSTASIS) ×1 IMPLANT
KIT BASIN OR (CUSTOM PROCEDURE TRAY) ×1 IMPLANT
KIT TURNOVER KIT B (KITS) ×1 IMPLANT
NDL HYPO 21X1.5 SAFETY (NEEDLE) ×1 IMPLANT
NDL HYPO 22X1.5 SAFETY MO (MISCELLANEOUS) ×1 IMPLANT
NDL SPNL 18GX3.5 QUINCKE PK (NEEDLE) IMPLANT
NEEDLE HYPO 21X1.5 SAFETY (NEEDLE) ×1 IMPLANT
NEEDLE HYPO 22X1.5 SAFETY MO (MISCELLANEOUS) ×1 IMPLANT
NEEDLE SPNL 18GX3.5 QUINCKE PK (NEEDLE) IMPLANT
NS IRRIG 1000ML POUR BTL (IV SOLUTION) ×1 IMPLANT
PACK LAMINECTOMY NEURO (CUSTOM PROCEDURE TRAY) ×1 IMPLANT
PAD ARMBOARD POSITIONER FOAM (MISCELLANEOUS) ×3 IMPLANT
PATTIES SURGICAL .5 X.5 (GAUZE/BANDAGES/DRESSINGS) ×1 IMPLANT
PATTIES SURGICAL 1X1 (DISPOSABLE) ×1 IMPLANT
PIN MAYFIELD SKULL DISP (PIN) ×1 IMPLANT
ROD PRE CUT 3.5X25 (Rod) IMPLANT
SCREW MULTI AXIAL 3.5X16MM (Screw) IMPLANT
SCREW MULTI AXIAL 3.5X18MM (Screw) IMPLANT
SCREW MULTI AXIAL PT 3.5X24 (Screw) IMPLANT
SET SCREW INFINITY IFIX THOR (Screw) IMPLANT
SLEEVE SURGEON STRL (DRAPES) IMPLANT
SPIKE FLUID TRANSFER (MISCELLANEOUS) ×1 IMPLANT
SPONGE SURGIFOAM ABS GEL SZ50 (HEMOSTASIS) IMPLANT
SPONGE T-LAP 4X18 ~~LOC~~+RFID (SPONGE) IMPLANT
SUT MNCRL AB 4-0 PS2 18 (SUTURE) ×1 IMPLANT
SUT VIC AB 0 CT1 18XCR BRD8 (SUTURE) ×1 IMPLANT
SUT VIC AB 2-0 CP2 18 (SUTURE) ×1 IMPLANT
SYR 20ML LL LF (SYRINGE) ×1 IMPLANT
SYR 30ML SLIP (SYRINGE) ×1 IMPLANT
SYR 3ML LL SCALE MARK (SYRINGE) IMPLANT
SYR TB 1ML 25GX5/8 (SYRINGE) IMPLANT
TOWEL GREEN STERILE (TOWEL DISPOSABLE) ×1 IMPLANT
TOWEL GREEN STERILE FF (TOWEL DISPOSABLE) ×1 IMPLANT
WATER STERILE IRR 1000ML POUR (IV SOLUTION) ×1 IMPLANT

## 2024-02-05 NOTE — Transfer of Care (Signed)
 Immediate Anesthesia Transfer of Care Note  Patient: Earl Campbell  Procedure(s) Performed: POSTERIOR CERVICAL FUSION/FORAMINOTOMY LEVEL 4 (Spine Cervical)  Patient Location: PACU  Anesthesia Type:General  Level of Consciousness: sedated  Airway & Oxygen Therapy: Patient Spontanous Breathing and Patient connected to face mask oxygen  Post-op Assessment: Report given to RN and Post -op Vital signs reviewed and stable  Post vital signs: Reviewed and stable  Last Vitals:  Vitals Value Taken Time  BP 194/90 02/05/24 1517  Temp    Pulse 70 02/05/24 1520  Resp 9 02/05/24 1520  SpO2 99 % 02/05/24 1520  Vitals shown include unfiled device data.  Last Pain:  Vitals:   02/05/24 0922  TempSrc:   PainSc: 10-Worst pain ever      Patients Stated Pain Goal: 2 (02/05/24 6045)  Complications: No notable events documented.Dr Allean [place] aware of elevated BP

## 2024-02-05 NOTE — Anesthesia Procedure Notes (Addendum)
 Procedure Name: Intubation Date/Time: 02/05/2024 10:45 AM  Performed by: Leslye Rast, MDPre-anesthesia Checklist: Patient identified, Emergency Drugs available, Suction available and Patient being monitored Patient Re-evaluated:Patient Re-evaluated prior to induction Oxygen Delivery Method: Circle System Utilized Preoxygenation: Pre-oxygenation with 100% oxygen Induction Type: IV induction Tube type: Oral Tube size: 7.5 mm Number of attempts: 1 Airway Equipment and Method: Stylet, Oral airway and Fiberoptic brochoscope Placement Confirmation: ETT inserted through vocal cords under direct vision, positive ETCO2 and breath sounds checked- equal and bilateral Tube secured with: Tape Dental Injury: Teeth and Oropharynx as per pre-operative assessment  Comments: Asleep fiberoptic intubation performed due to odontoid fracture with myelopathy. Patient positioned neck in comfortable position prior to induction; head and neck maintained in this position throughout intubation with no flexion or extension. Straightforward FOB intubation.

## 2024-02-05 NOTE — Anesthesia Procedure Notes (Signed)
 Arterial Line Insertion Start/End5/10/2023 10:52 AM, 02/05/2024 10:54 AM Performed by: Leslye Rast, MD, anesthesiologist  Patient location: Pre-op. Preanesthetic checklist: patient identified, IV checked, site marked, risks and benefits discussed, surgical consent, monitors and equipment checked, pre-op evaluation, timeout performed and anesthesia consent Lidocaine  1% used for infiltration Right, radial was placed Catheter size: 20 G Hand hygiene performed  and maximum sterile barriers used   Attempts: 1 Procedure performed without using ultrasound guided technique. Ultrasound Notes:anatomy identified, needle tip was noted to be adjacent to the nerve/plexus identified and no ultrasound evidence of intravascular and/or intraneural injection Following insertion, dressing applied. Post procedure assessment: normal and unchanged  Post procedure complications: second provider assisted. Patient tolerated the procedure well with no immediate complications.

## 2024-02-05 NOTE — Op Note (Signed)
 PREOP DIAGNOSIS: Chronic odontoid fracture with myelopathy  POSTOP DIAGNOSIS: Chronic odontoid fracture with myelopathy   PROCEDURE: 1. Open reduction of C2 fracture 2. Posterior arthrodesis C1-2 3. C1 laminectomy, C2 laminectomy and C2-3 posterior decompression 4. Segmental instrumentation with C1 lateral mass and C2 pars screws 6. Harvest of local autograft 7. Use of morselized bone allograft   SURGEON: Dr. Arvilla Birmingham, MD  ASSISTANT: Easter Golden, PA.  Please note, no qualified trainees were available to assist with the procedure.  Assistance was required for retraction of the visceral structures to safely allow for instrumentation.  ANESTHESIA: General Endotracheal  EBL: 75 ml  IMPLANTS: Medtronic 24 mm C1 lateral mass screws 16mm R/18 mm L  C2 pars screws  SPECIMENS: None  DRAINS: None  COMPLICATIONS: None immediate  CONDITION: Hemodynamically stable to PACU  HISTORY: Earl Campbell is a 63 y.o. y.o. male male with hx of CAD, hep C CKD who suffered a type 2 odontoid fracture with retrolisthesis.  He developed occipital neuralgia and neck pain and surgery was planned in 2024 but surgery was cancelled as he was deemed not a surgical candidate due to uncontrolled medical comorbidities, including untreated urinary obstruction with hydronephrosis.  He recently underwent urethral stricture dilation on 01/09/24.  Unfortunately, he began developing progressive myelopathy with hand weakness, functional loss, gait instability.  He continues to have neck pain.  MRI recently performed showed worsened retrolisthesis and now severe stenosis at C1-2 with significant cord signal change.  He was urgently scheduled for surgery.  Risks, benefits, alternatives, and expected convalescence were discussed with the patient.  Risks discussed included but were not limited to bleeding, pain, infection, pseudoarthrosis, hardware failure, adjacent segment disease, CSF leak, neurologic deficits,  weakness, numbness, paralysis, coma, and death. After all questions were answered, informed consent was obtained.  PROCEDURE IN DETAIL: The patient was brought to the operating room. After induction of general anesthesia, patient underwent fiberoptic intubation.  After appropriate lines and monitors were placed, baseline MEPs and SSEPs were obtained, noting diminished SSEPs, though good motors. patient's head was placed in a Mayfield head holder and the patient was positioned prone on the operative table with all pressure points meticulously padded and secured in neutral position. MEPs improved after positioning. Under C-arm guidance, I then performed gentle traction of the head and neck using the Mayfield to partially reduce the fracture. MEPs remained improved compared with preoperatively and SSEPs were unchanged.  The skin of the posterior neck was then prepped and draped in the usual sterile fashion.  X-ray was used to localize incision over the appropriate after timeout was conducted, the skin was infiltrated with local anesthetic. Skin incision was then made sharply and Bovie electrocautery was used to dissect the subcutaneous tissue and sharply opened the fascia.  Paraspinous muscles were dissected off from C1 and C2 lamina in subperiosteal fashion.  Self-retaining retractor was used.  X-ray was used to confirm localization.  The underside of the posterior C1 arch was dissected laterally and then anteriorly to the lateral mass bilaterally.  Paravertebral venous plexus bleeding was controlled with Surgi-flo and biploar electrocautery.  As the patient had a fair amount of occipital neuralgia as well as to increased bony surface area for fusion, bilateral C2 neurectomies were performed to expose the C2 pars and C1 lateral mass.  Using C-arm x-ray, pilot holes were drilled for C1 lateral mass screws, following by tapping.  Good purchase was noted and ball ended feeler confirmed good bony channels.  Using  rongeurs  and high speed drill, C1 and C2 laminectomies were performed, with bone harvested for autograft.  C2-3 posterior decompression was also performed by removing the C2-3 ligamentum flavum.  Good posterior drift of the thecal sac was noted.  Easy passage of nerve hook revealed no significant ongoing cord impingement.  The C1-2 joint spaces were decorticated with a curette and packed with autograft.  C1 lateral mass screws were then placed using C-arm guidance.  The medial aspect of the C2 pars was identified and pilot holes drilled for C2 pars screws using C-arm guidance.  The screw paths were tapped with ball ended feeler confirming good bony channels.  Pars screws were placed with good bony purchase.  Rods was then placed in the tulip head and secured with screw caps and final tightened.  Final x-ray confirmed appropriate instrumentation placement.  MEPs remained improved compared with baseline and SSEPs were unchanged.  The wound was irrigated thoroughly with antibiotic impregnated irrigation.  Autograft mixed with allograft was placed in the lateral gutters bilaterally.  Vancomycin  powder was sprinkled into the wound.   Exparel  was injected into the paraspinous muscles.The muscle layer was closed with 0 Vicryl stitches.  The fascia was closed with 0 Vicryl stitches.  The subcutaneous layer was closed with 0 Vicryl stitches.  The dermal layer was closed with 2-0 Vicryl stitches in buried fashion.  Skin was closed with 4-0 monocryl in subcuticular manner.  A sterile dressing was then placed patient was then removed from Mayfield head holder and flipped supine and extubated by the anesthesia service.  All counts were correct at the end of surgery.  No complications were noted.

## 2024-02-05 NOTE — H&P (Signed)
 CC: hand weakness, neck pain   HPI:     Patient is a 63 y.o. male with hx of CAD, hep C CKD who suffered a type 2 odontoid fracture with retrolisthesis.  He developed occipital neuralgia and neck pain and surgery was planned in 2024 but surgery was cancelled as he was deemed not a surgical candidate due to uncontrolled medical comorbidities, including untreated urinary obstruction with hydronephrosis.  He recently underwent urethral stricture dilation on 01/09/24.  Unfortunately, he began developing progressive myelopathy with hand weakness, functional loss, gait instability.  He continues to have neck pain.  MRI recently performed showed worsened retrolisthesis and now severe stenosis at C1-2 with significant cord signal change.  He was urgently scheduled for surgery.     Patient Active Problem List   Diagnosis Date Noted   Preop cardiovascular exam 11/24/2017   Nondependent alcohol abuse, continuous drinking behavior 07/27/2013   Hepatitis C antibody test positive 07/27/2013   Anxiety disorder 07/27/2013   CAD S/P percutaneous coronary angioplasty 12/29/2012   Essential hypertension 12/29/2012   Hyperlipidemia 12/29/2012   Tobacco abuse 12/29/2012   Past Medical History:  Diagnosis Date   Anemia    takes iron    CAD S/P percutaneous coronary angioplasty 07/2007    abnormal nuclear ST --> Cath --> 80%mLAD - PCI with Promus DES 3.0 mm x 23 mm (~3.2 mm).[place], [place] .  - Patent stent with ~30% RCA on relook cath 04/2008   Chronic kidney disease 01/2024   Hydronephrosis   COPD (chronic obstructive pulmonary disease) (HCC)    Headache    Hepatitis 07/27/2013   Hep C - without hepatic coma   Hyperlipidemia    Hypertension    Myocardial infarction [facility])    Prior MI; Stent [place], [place]  around 2009    Past Surgical History:  Procedure Laterality Date   CARDIAC CATHETERIZATION  07/30/2007   (Dr. Lynnie Saucier --[facility] [place], [place] ): abnormal Nuclear  ST ordered to evaluate chest pain- 80% mLAD just after the major diagonal--> PCI; small ramus intermedius noted.  That the circumflex are relatively normal.  Dominant RCA relatively normal.  EF 70%.   CARDIAC CATHETERIZATION  04/2008   Rannie Byars [place], [place]; Patent mLAD stent, 30%.  EF at least 55%.   COLONOSCOPY WITH PROPOFOL  Bilateral 10/21/2023   Procedure: COLONOSCOPY WITH PROPOFOL ;  Surgeon: Ozell Blunt, MD;  Location: [facility];  Service: Gastroenterology;  Laterality: Bilateral;   CORONARY STENT PLACEMENT  07/30/2007   PCI - mLAD - Promus DES 3.0 mm x 23 mm (3.2 mm).   CYSTOSCOPY WITH URETHRAL DILATATION  01/09/2024   (in [place])  with RUG (retrograde urethrography) in [facility], cystourethroscopy, PR CYSTOSCOPY,DIL URETHRAL STRICTURE  PR INJECT FOR RETROGRADE URETHOCYSTO  CHG X-RAY URETHROCYSTOGRAM   ESOPHAGOGASTRODUODENOSCOPY (EGD) WITH PROPOFOL  Bilateral 10/21/2023   Procedure: ESOPHAGOGASTRODUODENOSCOPY (EGD) WITH PROPOFOL ;  Surgeon: Ozell Blunt, MD;  Location: [facility];  Service: Gastroenterology;  Laterality: Bilateral;   TONSILLECTOMY      Medications Prior to Admission  Medication Sig Dispense Refill Last Dose/Taking   D-1000 EXTRA STRENGTH 25 MCG (1000 UT) tablet Take 1,000 Units by mouth in the morning.   Past Month   diltiazem  (CARDIZEM  CD) 180 MG 24 hr capsule Take 180 mg by mouth in the morning.   02/05/2024 at  7:30 AM   ferrous gluconate  (FERGON) 324 MG tablet Take 324 mg by mouth in the morning.   Past Month   ferrous sulfate  325 (65 FE)  MG EC tablet Take 325 mg by mouth in the morning.   Past Month   HYDROcodone -acetaminophen  (NORCO) 10-325 MG tablet Take 1 tablet by mouth every 6 (six) hours as needed (pain.).   02/04/2024   aspirin 325 MG EC tablet Take 325 mg by mouth daily.   01/30/2024   ibuprofen (ADVIL) 200 MG tablet Take 200-400 mg by mouth every 8 (eight) hours as needed (pain.).      No Known Allergies  Social History   Tobacco Use    Smoking status: Every Day    Current packs/day: 0.50    Average packs/day: 0.5 packs/day for 30.0 years (15.0 ttl pk-yrs)    Types: Cigarettes   Smokeless tobacco: Never   Tobacco comments:    Has cut down from 1 pack/day to 1/2 ppd per pt as of 02/03/24  Substance Use Topics   Alcohol use: Yes    Comment: 6-12 beers per night    Family History  Problem Relation Age of Onset   Heart disease Unknown        No family history   Hypertension Mother        Was also a smoker   Hypertension Father    Heart disease Brother        Is a patient of Dr. Alexandria Angel     Review of Systems Pertinent items are noted in HPI.  Objective:   Patient Vitals for the past 8 hrs:  BP Temp Temp src Pulse Resp SpO2 Height Weight  02/05/24 0842 (!) 173/81 (!) 97.3 F (36.3 C) Oral 65 18 99 % 5\' 10"  (1.778 m) 60.8 kg   No intake/output data recorded. No intake/output data recorded.      General : Alert, cooperative, no distress, appears stated age   Head:  Normocephalic/atraumatic    Eyes: PERRL, conjunctiva/corneas clear, EOM's intact. Fundi could not be visualized Neck: Supple Chest:  Respirations unlabored Chest wall: no tenderness or deformity Heart: Regular rate and rhythm Abdomen: Soft, nontender and nondistended Extremities: warm and well-perfused Skin: normal turgor, color and texture Neurologic:  Alert, oriented x 3.  Eyes open spontaneously. PERRL, EOMI, VFC, no facial droop. V1-3 intact.  No dysarthria, tongue protrusion symmetric.  CNII-XII intact. Hyperreflexia 4+ throughout, + Hoffman's bilaterally, 4-/5 hand grip.       Data ReviewCBC:  Lab Results  Component Value Date   WBC 5.1 02/03/2024   RBC 3.84 (L) 02/03/2024   BMP:  Lab Results  Component Value Date   GLUCOSE 123 (H) 02/03/2024   CO2 18 (L) 02/03/2024   BUN 27 (H) 02/03/2024   CREATININE 1.88 (H) 02/03/2024   CALCIUM  9.1 02/03/2024   Radiology review:   Recent MRI was personally reviewed.   Redemonstrates chronic odontoid fracture. Worsening of C1-2 retrolisthesis, now with significant upper cervical spinal cord impingement and cord signal change.  Assessment:   Active Problems:   * No active hospital problems. *  This is a 63 yo M with chronic odontoid type 2 fracture who has developed worsening myelopathy symptoms.   Plan:   - I had a long discussion with the patient and son.  Although he is not an optimal surgical candidate with his medical comorbidities and smoking, he now requires this surgery urgently to prevent eventual quadriparesis and death.  Surgery will entail posterior cervical decompression, attempt at reduction of chronic fracture, and instrumented fusion.  Possibility of extension to additional levels were discussed.  Risks, benefits, alternatives, and expected convalescence were discussed  with him and son.  Risks discussed included, but were not limited to bleeding, pain, infection, scar, spinal fluid leak, neurologic deficit, instability, pseudoarthrosis, damage to nearby organs, stroke and death.  Possibility of blood transfusion was also discussed.  Informed consent was obtained.

## 2024-02-06 DIAGNOSIS — F10930 Alcohol use, unspecified with withdrawal, uncomplicated: Secondary | ICD-10-CM

## 2024-02-06 DIAGNOSIS — R338 Other retention of urine: Secondary | ICD-10-CM | POA: Diagnosis not present

## 2024-02-06 LAB — BASIC METABOLIC PANEL WITH GFR
Anion gap: 10 (ref 5–15)
BUN: 24 mg/dL — ABNORMAL HIGH (ref 8–23)
CO2: 20 mmol/L — ABNORMAL LOW (ref 22–32)
Calcium: 8.9 mg/dL (ref 8.9–10.3)
Chloride: 105 mmol/L (ref 98–111)
Creatinine, Ser: 1.8 mg/dL — ABNORMAL HIGH (ref 0.61–1.24)
GFR, Estimated: 41 mL/min — ABNORMAL LOW (ref >60)
Glucose, Bld: 134 mg/dL — ABNORMAL HIGH (ref 70–99)
Potassium: 3.5 mmol/L (ref 3.5–5.1)
Sodium: 135 mmol/L (ref 135–145)

## 2024-02-06 NOTE — Progress Notes (Addendum)
    Providing Compassionate, Quality Care - Together   NEUROSURGERY PROGRESS NOTE     S: No issues overnight.    O: EXAM:  BP (!) 164/78 (BP Location: Left Arm)   Pulse 73   Temp (!) 97.5 F (36.4 C) (Axillary)   Resp 17   Ht 5\' 10"  (1.778 m)   Wt 60.8 kg   SpO2 97%   BMI 19.23 kg/m     Awake, alert, oriented  Speech fluent, appropriate  CNs grossly intact  BUE/BLE 4/5 SILTx4 Dressing c/d/I Collar in place Gait not assessed   ASSESSMENT:  63 y.o. s/p C1-2 PCDF    PLAN: -Continue CIWA protocol -Collar at all times -Supportive care -Therapies as tolerated -Consider dc to home this PM after therapies -Call w/ questions/concerns.   Easter Golden, [facility]

## 2024-02-06 NOTE — Progress Notes (Signed)
   Inpatient Rehab Admissions Coordinator :  Per therapy recommendations, patient was screened for CIR candidacy by Jeannetta Millman RN MSN.  At this time patient appears to be a potential candidate for CIR.  If MD would like him to be considered for admit, please advise and place Rehab Consult. Noted possible plan for D/C home. Question caregiver supports. Please call me with any questions.  Jeannetta Millman RN MSN Admissions Coordinator [phone]

## 2024-02-06 NOTE — Plan of Care (Signed)
  Problem: Activity: Goal: Risk for activity intolerance will decrease Outcome: Progressing   Problem: Coping: Goal: Level of anxiety will decrease Outcome: Progressing   Problem: Pain Managment: Goal: General experience of comfort will improve and/or be controlled Outcome: Progressing   Problem: Safety: Goal: Ability to remain free from injury will improve Outcome: Progressing

## 2024-02-06 NOTE — Progress Notes (Signed)
 Patient has been experiencing alcohol withdrawal with confusion, has been responding to ciwa protocol - he drinks 12 beers at home.  He was eager to go home this morning but with his myelopathy, but I believe rehab would give him the best chance for restoration of function.  He also has had some urinary retention, likely will need his foley replaced.

## 2024-02-06 NOTE — Consult Note (Addendum)
 Initial Consultation Note   Patient: Earl Campbell ZOX:[id number] DOB: 09/13/1961 PCP: Candiss Chamorro, MD DOA: 02/05/2024 DOS: the patient was seen and examined on 02/06/2024 Primary service: Van Gelinas, MD  Referring physician: Arvilla Birmingham, MD Reason for consult: Alcohol withdrawals  Assessment/Plan: Assessment and Plan: No notes have been filed under this hospital service. Service: Hospitalist   Alcohol withdrawals Hx of alcohol use disorder Patient drinking about 12 beers per day at home, unclear duration. No history of DTs per chart review. Noted to be anxious and agitated earlier today, started on CIWA with ativan  by primary team. Per RN, patient thus far has only required one PO ativan  1mg  dose and one IV ativan  1mg  dose with improvement. CIWAs have ranged between 6-9 since this afternoon. Will continue CIWA with ativan  for now and trend CIWA scores. He is otherwise hemodynamically stable. -CIWA with ativan  -TOC consulted for substance use resources  Acute urinary retention Recent hx of urethral dilation (01/09/2024) Patient with recent urethral dilation last month at [facility]. He had a foley catheter in place but this was removed earlier today. Bladder scans thereafter showing 307cc with repeat an hour after at 555cc, requiring in and out cath. Will replace foley catheter. Will need outpatient urology follow up for voiding trial/removal. -place foley catheter -f/u with urology as outpatient  C2 cervical fracture s/p C1-2 posterior cervical fusion/foraminotomy (POD 1) -Neurosurgery following as primary team -collar at all times -PT/OT -pain control per primary  HTN BP ranging in the 160s-180s/80s today. Possibly worsened due to pain.  -continue home diltiazem  180mg  every morning -pain control as noted above -consider additional antihypertensives as needed  CAD s/p PCI HLD -holding patient's home aspirin -not on any statin therapy at home   TRH will  continue to follow the patient.  HPI: Earl Campbell is a 63 y.o. male with past medical history of urethral dilation, alcohol use disorder, C2 cervical fracture, HTN, CAD s/p PCI, HLD who presented with progressive myelopathy with hand weakness, functional loss, gait instability, and neck pain. Recent MRI showed worsened retrolisthesis and now severe stenosis at C1-2 with significant cord signal change. He was urgently scheduled for surgery and admitted by neurosurgery.  Patient underwent C1-2 PCDF yesterday and has been recovering well. This morning, patient became agitated and anxious wanting to leave the hospital to get back home. Given concern for alcohol withdrawals, patient was placed on CIWA protocol and noted to have elevated CIWA scores. Patient also noted to have urinary retention after removal of foley catheter earlier today.  TRH asked to consult on patient to help manage alcohol withdrawals.   Review of Systems: As mentioned in the history of present illness. All other systems reviewed and are negative. Past Medical History:  Diagnosis Date   Anemia    takes iron    CAD S/P percutaneous coronary angioplasty 07/2007    abnormal nuclear ST --> Cath --> 80%mLAD - PCI with Promus DES 3.0 mm x 23 mm (~3.2 mm).[place], [place] .  - Patent stent with ~30% RCA on relook cath 04/2008   Chronic kidney disease 01/2024   Hydronephrosis   COPD (chronic obstructive pulmonary disease) (HCC)    Headache    Hepatitis 07/27/2013   Hep C - without hepatic coma   Hyperlipidemia    Hypertension    Myocardial infarction [facility])    Prior MI; Stent [place], [place]  around 2009   Past Surgical History:  Procedure Laterality Date   CARDIAC CATHETERIZATION  07/30/2007   (Dr.  Richard Sullivan Endow --[facility] [place], [place] ): abnormal Nuclear ST ordered to evaluate chest pain- 80% mLAD just after the major diagonal--> PCI; small ramus intermedius noted.  That the circumflex are  relatively normal.  Dominant RCA relatively normal.  EF 70%.   CARDIAC CATHETERIZATION  04/2008   [facility] Byars [place], [place]; Patent mLAD stent, 30%.  EF at least 55%.   COLONOSCOPY WITH PROPOFOL  Bilateral 10/21/2023   Procedure: COLONOSCOPY WITH PROPOFOL ;  Surgeon: Ozell Blunt, MD;  Location: [facility];  Service: Gastroenterology;  Laterality: Bilateral;   CORONARY STENT PLACEMENT  07/30/2007   PCI - mLAD - Promus DES 3.0 mm x 23 mm (3.2 mm).   CYSTOSCOPY WITH URETHRAL DILATATION  01/09/2024   (in [place])  with RUG (retrograde urethrography) in [place], cystourethroscopy, PR CYSTOSCOPY,DIL URETHRAL STRICTURE  PR INJECT FOR RETROGRADE URETHOCYSTO  CHG X-RAY URETHROCYSTOGRAM   ESOPHAGOGASTRODUODENOSCOPY (EGD) WITH PROPOFOL  Bilateral 10/21/2023   Procedure: ESOPHAGOGASTRODUODENOSCOPY (EGD) WITH PROPOFOL ;  Surgeon: Ozell Blunt, MD;  Location: [facility];  Service: Gastroenterology;  Laterality: Bilateral;   TONSILLECTOMY     Social History:  reports that he has been smoking cigarettes. He has a 15 pack-year smoking history. He has never used smokeless tobacco. He reports current alcohol use. He reports that he does not use drugs.  No Known Allergies  Family History  Problem Relation Age of Onset   Heart disease Unknown        No family history   Hypertension Mother        Was also a smoker   Hypertension Father    Heart disease Brother        Is a patient of Dr. Alexandria Angel    Prior to Admission medications   Medication Sig Start Date End Date Taking? Authorizing Provider  D-1000 EXTRA STRENGTH 25 MCG (1000 UT) tablet Take 1,000 Units by mouth in the morning. 11/21/23  Yes [provider]  diltiazem  (CARDIZEM  CD) 180 MG 24 hr capsule Take 180 mg by mouth in the morning. 01/12/24  Yes [provider]  ferrous gluconate  (FERGON) 324 MG tablet Take 324 mg by mouth in the morning. 11/27/23  Yes [provider]  ferrous sulfate  325 (65 FE) MG EC  tablet Take 325 mg by mouth in the morning.   Yes [provider]  HYDROcodone -acetaminophen  (NORCO) 10-325 MG tablet Take 1 tablet by mouth every 6 (six) hours as needed (pain.).   Yes [provider]  aspirin 325 MG EC tablet Take 325 mg by mouth daily.    [provider]  ibuprofen (ADVIL) 200 MG tablet Take 200-400 mg by mouth every 8 (eight) hours as needed (pain.).    [provider]    Physical Exam: Vitals:   02/06/24 0846 02/06/24 1214 02/06/24 1413 02/06/24 1530  BP: (!) 176/73 (!) 162/73 (!) 166/84 (!) 179/79  Pulse: 69 64 71 77  Resp: (!) 22 14  13   Temp: 98.5 F (36.9 C) 97.8 F (36.6 C) 97.8 F (36.6 C) 97.8 F (36.6 C)  TempSrc: Oral Oral Oral Oral  SpO2: 99% 98% 96%   Weight:      Height:       Physical Exam Constitutional:      Appearance: Normal appearance. He is not ill-appearing.  HENT:     Mouth/Throat:     Mouth: Mucous membranes are moist.     Pharynx: Oropharynx is clear. No oropharyngeal exudate.  Eyes:  General: No scleral icterus.    Extraocular Movements: Extraocular movements intact.     Conjunctiva/sclera: Conjunctivae normal.     Pupils: Pupils are equal, round, and reactive to light.  Neck:     Comments: Collar in place. Cardiovascular:     Rate and Rhythm: Normal rate and regular rhythm.     Heart sounds: Normal heart sounds. No murmur heard.    No friction rub. No gallop.  Pulmonary:     Effort: Pulmonary effort is normal. No respiratory distress.     Breath sounds: Normal breath sounds. No wheezing, rhonchi or rales.  Abdominal:     General: Bowel sounds are normal. There is no distension.     Palpations: Abdomen is soft.     Tenderness: There is no abdominal tenderness. There is no guarding or rebound.  Musculoskeletal:        General: No swelling.  Skin:    General: Skin is warm and dry.  Neurological:     General: No focal deficit present.     Mental Status: He is alert and oriented to  person, place, and time.    Data Reviewed:   There are no new results to review at this time.    Family Communication: no family at bedside Primary team communication: discussed with primary team. Thank you very much for involving us  in the care of your patient.  Author: Shenae Bonanno H Rie Mcneil, MD 02/06/2024 3:56 PM  For on call review [email or web address].

## 2024-02-06 NOTE — Plan of Care (Signed)

## 2024-02-06 NOTE — Evaluation (Signed)
 Occupational Therapy Evaluation Patient Details Name: Earl Campbell MRN: [id number] DOB: 09/23/1961 Today's Date: 02/06/2024   History of Present Illness   63 yo with progressive myelopathy and gait instability due to type 2 odontoid fx. 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 Underwent urgent open reduction of C2 fx, posteiror arthrodesis C1-2, C1-2 laminectomy, C2-3 posterior decompression. PMH:CAD, hep C CKD, urethral stricture dilation on 01/09/24.     Clinical Impressions PTA pt lives alone and works at an [employer]. Pt states his walking is more unsteady and he has experienced more difficulty with ADL and IADL tasks due to increased difficulty using his hands. 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 Began education on compensatory strategies regarding cervical precautions for ADL and mobility.. Pt overall min A for ADL and mobility and is a high risk for falls. Decreased insight/awareness regarding need to follow cervical precautions and his risk for falls. If pt is agreeable, feel he would benefit from intensive inpatient follow-up therapy, >3 hours/day to reach independent level goals to facilitate a safe DC home. Acute OT to follow.      If plan is discharge home, recommend the following:   A little help with bathing/dressing/bathroom;Assistance with cooking/housework;Assist for transportation     Functional Status Assessment   Patient has had a recent decline in their functional status and demonstrates the ability to make significant improvements in function in a reasonable and predictable amount of time.     Equipment Recommendations   None recommended by OT (DME will not fit into his shower)     Recommendations for Other Services   Rehab consult     Precautions/Restrictions   Precautions Precautions:  Fall;Cervical Precaution Booklet Issued: Yes (comment) Recall of Precautions/Restrictions: Impaired Required Braces or Orthoses: Cervical Brace Cervical Brace: Hard collar;At all times Restrictions Weight Bearing Restrictions Per Provider Order: No     Mobility Bed Mobility               General bed mobility comments: OOB in chair    Transfers Overall transfer level: Needs assistance Equipment used: None Transfers: Sit to/from Stand                    Balance Overall balance assessment: Needs assistance Sitting-balance support: Feet supported Sitting balance-Leahy Scale: Good     Standing balance support: No upper extremity supported, During functional activity Standing balance-Leahy Scale: Poor                             ADL either performed or assessed with clinical judgement   ADL Overall ADL's : Needs assistance/impaired Eating/Feeding: Supervision/ safety;Set up (difficulty opening packages; holding utensils, cutting food)   Grooming: Minimal assistance   Upper Body Bathing: Minimal assistance;Sitting   Lower Body Bathing: Minimal assistance;Sit to/from stand   Upper Body Dressing : Moderate assistance Upper Body Dressing Details (indicate cue type and reason): Pt unable ot adjust his C-collar Lower Body Dressing: Minimal assistance;Sit to/from stand   Toilet Transfer: Ambulation;Minimal assistance;Rolling walker (2 wheels)   Toileting- Clothing Manipulation and Hygiene: Supervision/safety       Functional mobility during ADLs: Minimal assistance;Cueing for safety;Rolling walker (2 wheels) General ADL Comments: Issued built up tubing for utensils and toothbrush; began education regarding compensatory techniques to compensate for poor grip adn cervical precautions  Vision         Perception         Praxis         Pertinent Vitals/Pain Pain Assessment Pain Assessment: 0-10 Pain Score: 10-Worst pain ever (I'm at a 10  everyday) Pain Location: surgical site Pain Descriptors / Indicators: Grimacing, Operative site guarding Pain Intervention(s): Limited activity within patient's tolerance     Extremity/Trunk Assessment Upper Extremity Assessment Upper Extremity Assessment: Left hand dominant;RUE deficits/detail;LUE deficits/detail RUE Deficits / Details: neurapraxia; hand is numb; grip @ 3+/5; able to oppose thumb to all digits; poor fine-motor/manipulation skills RUE Sensation: decreased proprioception;decreased light touch (hand is "numb") RUE Coordination: decreased fine motor LUE Deficits / Details: similar to R LUE Sensation: decreased light touch;decreased proprioception LUE Coordination: decreased fine motor   Lower Extremity Assessment Lower Extremity Assessment: Defer to PT evaluation RLE Coordination: decreased gross motor LLE Coordination: decreased gross motor   Cervical / Trunk Assessment Cervical / Trunk Assessment: Other exceptions;Neck Surgery (forward head posture)   Communication Communication Communication: No apparent difficulties   Cognition Arousal: Alert Behavior During Therapy: Impulsive Cognition: No family/caregiver present to determine baseline (most likely baseline)                               Following commands: Impaired Following commands impaired: Only follows one step commands consistently     Cueing  General Comments   Cueing Techniques: Verbal cues;Tactile cues;Visual cues  on entry cervical brace under pt's lip. Cervical collar adjusted properly under his chin   Exercises Exercises: Other exercises Other Exercises Other Exercises: pink squeeze foam for girp and pinch strengthening Other Exercises: yellow T putty for grip and pinch strengthening   Shoulder Instructions      Home Living Family/patient expects to be discharged to:: Private residence Living Arrangements: Alone Available Help at Discharge: Friend(s);Available  PRN/intermittently Reymundo Caulk, neighbor who lives beside him) Type of Home: Apartment Home Access: Stairs to enter [employer] of Steps: 1 (grabs screen door to get up and down step)   Home Layout: One level     Bathroom Shower/Tub: Producer, television/film/video: Standard     Home Equipment: None          Prior Functioning/Environment Prior Level of Function : Independent/Modified Independent;Driving;Working/employed    ADL tasks were difficult per pt - he had difficulty with buttons, zippers and opening any container. Pt uses a wrench to pull up his zipper and a spoon to open containers.          Mobility Comments: Education administrator; does framework and paints cars      OT Problem List: Decreased strength;Decreased range of motion;Decreased activity tolerance;Impaired balance (sitting and/or standing);Decreased coordination;Decreased safety awareness;Decreased knowledge of use of DME or AE;Decreased knowledge of precautions;Impaired sensation;Impaired tone;Impaired UE functional use;Pain   OT Treatment/Interventions: Self-care/ADL training;Therapeutic exercise;Neuromuscular education;DME and/or AE instruction;Therapeutic activities;Patient/family education;Balance training      OT Goals(Current goals can be found in the care plan section)   Acute Rehab OT Goals Patient Stated Goal: go home OT Goal Formulation: With patient Time For Goal Achievement: 02/20/24 Potential to Achieve Goals: Good   OT Frequency:  Min 2X/week    Co-evaluation              AM-PAC OT "6 Clicks" Daily Activity     Outcome Measure Help from another person eating meals?: A Little Help from another person taking care of personal  grooming?: A Little Help from another person toileting, which includes using toliet, bedpan, or urinal?: A Little Help from another person bathing (including washing, rinsing, drying)?: A Little Help from another person to put on and taking off  regular upper body clothing?: A Lot Help from another person to put on and taking off regular lower body clothing?: A Little 6 Click Score: 17   End of Session Equipment Utilized During Treatment: Gait belt;Rolling walker (2 wheels) Nurse Communication: Mobility status;Patient requests pain meds  Activity Tolerance: Patient tolerated treatment well Patient left: in chair;with call bell/phone within reach;with chair alarm set  OT Visit Diagnosis: Unsteadiness on feet (R26.81);Other abnormalities of gait and mobility (R26.89);Muscle weakness (generalized) (M62.81);Pain Pain - part of body:  (neck)                Time: 1610-9604 OT Time Calculation (min): 31 min Charges:  OT General Charges $OT Visit: 1 Visit OT Evaluation $OT Eval Moderate Complexity: 1 Mod OT Treatments $Self Care/Home Management : 8-22 mins  Milburn Aliment, OT/L   Acute OT Clinical Specialist Acute Rehabilitation Services Pager [phone] Office [phone]   [facility] 02/06/2024, 12:24 PM

## 2024-02-06 NOTE — Anesthesia Postprocedure Evaluation (Signed)
 Anesthesia Post Note  Patient: Earl Campbell  Procedure(s) Performed: POSTERIOR CERVICAL FUSION/FORAMINOTOMY LEVEL 4 (Spine Cervical)     Patient location during evaluation: PACU Anesthesia Type: General Level of consciousness: awake and alert Pain management: pain level controlled Vital Signs Assessment: post-procedure vital signs reviewed and stable Respiratory status: spontaneous breathing, nonlabored ventilation, respiratory function stable and patient connected to nasal cannula oxygen Cardiovascular status: blood pressure returned to baseline and stable Postop Assessment: no apparent nausea or vomiting Anesthetic complications: no   No notable events documented.  Last Vitals:  Vitals:   02/06/24 0129 02/06/24 0338  BP: (!) 190/86 (!) 164/78  Pulse: 67 73  Resp: 15 17  Temp: (!) 36.4 C (!) 36.4 C  SpO2: 98% 97%    Last Pain:  Vitals:   02/06/24 0426  TempSrc:   PainSc: Asleep                 Leslye Rast

## 2024-02-06 NOTE — Progress Notes (Signed)
 Patient arrived to the unit via stretcher from PACU. Patient was admitted to room 12. Patient is alert and is able to follow commands. Neck brace in place, no acute distress noted. Patient was made comfortable in room with call light in place, bed at lowest position and with bed alarm on.

## 2024-02-06 NOTE — Evaluation (Addendum)
 Physical Therapy Evaluation Patient Details Name: Earl Campbell MRN: [id number] DOB: [birth year] Today's Date: 02/06/2024  History of Present Illness  63 yo with progressive myelopathy and gait instability due to type 2 odontoid fx. 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 Underwent urgent open reduction of C2 fx, posteiror arthrodesis C1-2, C1-2 laminectomy, C2-3 posterior decompression. PMH:CAD, hep C CKD, urethral stricture dilation on 01/09/24.  Clinical Impression  Pt admitted s/p procedure listed above. Pt reports bilateral hand numbness and difficulty feeding self. Pt presents with decreased functional mobility secondary to decreased gross motor coordination, pain, impaired standing balance, gait abnormalities. Pt requiring minimal assist for transfers and ambulating 120 ft. Trialed gait with and without RW; pt requires consistent safety cueing and instruction for proper use of assistive device. Pt also with decreased safety awareness and impulsivity throughout evaluation. Presents as a high fall risk based on deficits listed above. Patient will benefit from intensive inpatient follow-up therapy, >3 hours/day in order to address deficit and maximize functional independence, particularly since he lives alone, however, pt with preference to d/c home.       If plan is discharge home, recommend the following: A little help with walking and/or transfers;A little help with bathing/dressing/bathroom;Assistance with cooking/housework;Assist for transportation;Help with stairs or ramp for entrance   Can travel by private vehicle        Equipment Recommendations Rolling walker (2 wheels)  Recommendations for Other Services  Rehab consult    Functional Status Assessment Patient has had a recent decline in their functional status and demonstrates the ability to make significant improvements in function in a reasonable and predictable amount of  time.     Precautions / Restrictions Precautions Precautions: Fall;Cervical Precaution Booklet Issued: Yes (comment) Recall of Precautions/Restrictions: Impaired Required Braces or Orthoses: Cervical Brace Cervical Brace: Hard collar;At all times Restrictions Weight Bearing Restrictions Per Provider Order: No      Mobility  Bed Mobility Overal bed mobility: Needs Assistance Bed Mobility: Supine to Sit     Supine to sit: Supervision          Transfers Overall transfer level: Needs assistance Equipment used: None Transfers: Sit to/from Stand Sit to Stand: Min assist           General transfer comment: Assist to steady, pt bracing back onto bed, with wide BOS    Ambulation/Gait Ambulation/Gait assistance: Min assist, +2 safety/equipment Gait Distance (Feet): 120 Feet Assistive device: Rolling walker (2 wheels), None Gait Pattern/deviations: Step-through pattern, Decreased stride length, Decreased dorsiflexion - right, Decreased dorsiflexion - left, Wide base of support Gait velocity: decreased Gait velocity interpretation: <1.8 ft/sec, indicate of risk for recurrent falls   General Gait Details: Pt exhibiting an extremely wide BOS, despite cues. Decreased R sided coordination with medial whip during swing phase. Max cues for walker use, pt attempting to frequently pick it up rather than roll it.  Stairs            Wheelchair Mobility     Tilt Bed    Modified Rankin (Stroke Patients Only)       Balance Overall balance assessment: Needs assistance Sitting-balance support: Feet supported Sitting balance-Leahy Scale: Good     Standing balance support: No upper extremity supported, During functional activity Standing balance-Leahy Scale: Poor Standing balance comment: Bracing back onto bed in standing, utilizing wide BOS  Pertinent Vitals/Pain Pain Assessment Pain Assessment: Faces Faces Pain Scale: Hurts  even more Pain Location: surgical site Pain Descriptors / Indicators: Grimacing, Operative site guarding Pain Intervention(s): Monitored during session, Premedicated before session    Home Living Family/patient expects to be discharged to:: Private residence Living Arrangements: Alone Available Help at Discharge: Friend(s);Available PRN/intermittently Reymundo Caulk, neighbor who lives beside him) Type of Home: Apartment Home Access: Stairs to enter   [employer] of Steps: 1 (grabs screen door to get up and down step)   Home Layout: One level Home Equipment: None      Prior Function Prior Level of Function : Independent/Modified Independent             Mobility Comments: Education administrator; does framework and paints cars       Extremity/Trunk Assessment   Upper Extremity Assessment Upper Extremity Assessment: Defer to OT evaluation    Lower Extremity Assessment Lower Extremity Assessment: RLE deficits/detail;LLE deficits/detail RLE Coordination: decreased gross motor LLE Coordination: decreased gross motor    Cervical / Trunk Assessment Cervical / Trunk Assessment: Other exceptions;Neck Surgery (forward head posture)  Communication   Communication Communication: No apparent difficulties    Cognition Arousal: Alert Behavior During Therapy: Impulsive   PT - Cognitive impairments: No family/caregiver present to determine baseline, Awareness, Problem solving, Safety/Judgement                       PT - Cognition Comments: CIWA precautions. Pt highly impulsive. Able to verbalize some of his deficits, but has poor safety awareness and recognition of fall risk Following commands: Impaired Following commands impaired: Follows one step commands inconsistently     Cueing Cueing Techniques: Verbal cues, Tactile cues, Visual cues     General Comments      Exercises     Assessment/Plan    PT Assessment Patient needs continued PT services  PT  Problem List Decreased strength;Decreased activity tolerance;Decreased balance;Decreased mobility;Decreased coordination;Decreased cognition;Decreased safety awareness;Impaired sensation;Pain       PT Treatment Interventions DME instruction;Gait training;Stair training;Functional mobility training;Therapeutic activities;Balance training;Therapeutic exercise;Patient/family education    PT Goals (Current goals can be found in the Care Plan section)  Acute Rehab PT Goals Patient Stated Goal: go home PT Goal Formulation: With patient Time For Goal Achievement: 02/20/24 Potential to Achieve Goals: Good    Frequency Min 5X/week     Co-evaluation               AM-PAC PT "6 Clicks" Mobility  Outcome Measure Help needed turning from your back to your side while in a flat bed without using bedrails?: None Help needed moving from lying on your back to sitting on the side of a flat bed without using bedrails?: A Little Help needed moving to and from a bed to a chair (including a wheelchair)?: A Little Help needed standing up from a chair using your arms (e.g., wheelchair or bedside chair)?: A Little Help needed to walk in hospital room?: A Little Help needed climbing 3-5 steps with a railing? : A Lot 6 Click Score: 18    End of Session Equipment Utilized During Treatment: Gait belt;Cervical collar Activity Tolerance: Patient tolerated treatment well Patient left: in chair;with call bell/phone within reach;with chair alarm set Nurse Communication: Mobility status PT Visit Diagnosis: Unsteadiness on feet (R26.81);Other abnormalities of gait and mobility (R26.89);Difficulty in walking, not elsewhere classified (R26.2);Pain Pain - part of body:  (cervical)    Time: 2841-3244 PT Time Calculation (min) (ACUTE ONLY):  24 min   Charges:   PT Evaluation $PT Eval Low Complexity: 1 Low PT Treatments $Therapeutic Activity: 8-22 mins PT General Charges $$ ACUTE PT VISIT: 1 Visit          Verdia Glad, PT, DPT Acute Rehabilitation Services Office [phone]   Claria Crofts 02/06/2024, 10:16 AM

## 2024-02-07 DIAGNOSIS — S12191A Other nondisplaced fracture of second cervical vertebra, initial encounter for closed fracture: Secondary | ICD-10-CM | POA: Diagnosis not present

## 2024-02-07 DIAGNOSIS — F10930 Alcohol use, unspecified with withdrawal, uncomplicated: Secondary | ICD-10-CM | POA: Diagnosis not present

## 2024-02-07 MED ORDER — CHLORHEXIDINE GLUCONATE CLOTH 2 % EX PADS
6.0000 | MEDICATED_PAD | Freq: Every day | CUTANEOUS | Status: DC
  Administered 2024-02-07 – 2024-02-09 (×3): 6 via TOPICAL

## 2024-02-07 NOTE — Progress Notes (Signed)
 PT Cancellation Note  Patient Details Name: Earl Campbell MRN: [id number] DOB: 07/04/1961   Cancelled Treatment:    Reason Eval/Treat Not Completed: (P) Fatigue/lethargy limiting ability to participate. Pt requesting PT check back later as time permits.   Vernida Goodie, PT, DPT Acute Rehabilitation Services  Office: [phone]    Ellyn Hack 02/07/2024, 8:44 AM

## 2024-02-07 NOTE — Progress Notes (Signed)
 Occupational Therapy Treatment Patient Details Name: Earl Campbell MRN: [id number] DOB: [birth year] Today's Date: 02/07/2024   History of present illness 63 yo with progressive myelopathy and gait instability due to type 2 odontoid fx. 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 Underwent urgent open reduction of C2 fx, posteiror arthrodesis C1-2, C1-2 laminectomy, C2-3 posterior decompression. PMH:CAD, hep C CKD, urethral stricture dilation on 01/09/24.   OT comments  Pt seen for skilled OT therapy session.  Session limited secondary to pts. Lethargy.  Difficult to maintain arousal for participation.  Pt. Able to complete sit/stand MINA A with cues for maintaining precautions.  Short distance pivotal steps to eob.  Cont. Cues for precautions and bed mobility, requiring max a to bring b les into bed.  A/E for feeding, and dressing left in closet and will return tomorrow for education, demo, return demo for implementation of use during ADLs.          If plan is discharge home, recommend the following:  A little help with bathing/dressing/bathroom;Assistance with cooking/housework;Assist for transportation   Equipment Recommendations  None recommended by OT    Recommendations for Other Services Rehab consult    Precautions / Restrictions Precautions Precautions: Fall;Cervical Recall of Precautions/Restrictions: Impaired Required Braces or Orthoses: Cervical Brace Cervical Brace: Hard collar;At all times       Mobility Bed Mobility Overal bed mobility: Needs Assistance Bed Mobility: Sit to Supine       Sit to supine: Min assist        Transfers Overall transfer level: Needs assistance Equipment used: Rolling walker (2 wheels) Transfers: Sit to/from Stand, Bed to chair/wheelchair/BSC Sit to Stand: Min assist     Step pivot transfers: Mod assist     General transfer comment: max cues for maintaining precautions during  sit/stand. pt. with increased lethargy.  once up, able to hold onto RW with BUEs.  support to initiate pivotal steps.  cues for reaching bed before sitting down.  pt. with poor sitting balance, sig. post. lean. max a to bring bles into bed.     Balance                                           ADL either performed or assessed with clinical judgement   ADL Overall ADL's : Needs assistance/impaired                         Toilet Transfer: Ambulation;Minimal assistance;Rolling walker (2 wheels) Toilet Transfer Details (indicate cue type and reason): simulated with transfer from recliner to eob           General ADL Comments: brought in/provided several items but unable to demo/return demo secondary to lethargy.  will see tomorrow and re attempt    Extremity/Trunk Assessment              Vision       Perception     Praxis     Communication     Cognition Arousal: Lethargic Behavior During Therapy: Flat affect Cognition: No family/caregiver present to determine baseline                                        Cueing  Exercises      Shoulder Instructions       General Comments      Pertinent Vitals/ Pain       Pain Assessment Pain Assessment: No/denies pain Pain Intervention(s): Premedicated before session  Home Living                                          Prior Functioning/Environment              Frequency  Min 2X/week        Progress Toward Goals  OT Goals(current goals can now be found in the care plan section)  Progress towards OT goals: Progressing toward goals     Plan      Co-evaluation                 AM-PAC OT "6 Clicks" Daily Activity     Outcome Measure   Help from another person eating meals?: A Little Help from another person taking care of personal grooming?: A Little Help from another person toileting, which includes using toliet, bedpan, or  urinal?: A Little Help from another person bathing (including washing, rinsing, drying)?: A Little Help from another person to put on and taking off regular upper body clothing?: A Lot Help from another person to put on and taking off regular lower body clothing?: A Little 6 Click Score: 17    End of Session Equipment Utilized During Treatment: Gait belt;Rolling walker (2 wheels)  OT Visit Diagnosis: Unsteadiness on feet (R26.81);Other abnormalities of gait and mobility (R26.89);Muscle weakness (generalized) (M62.81);Pain   Activity Tolerance Patient limited by lethargy   Patient Left in bed;with call bell/phone within reach;with bed alarm set   Nurse Communication Other (comment) (spoke with rn regarding pts. lethargy, made aware assisting pt. back to bed)        Time: 6045-4098 OT Time Calculation (min): 17 min  Charges: OT General Charges $OT Visit: 1 Visit OT Treatments $Self Care/Home Management : 8-22 mins  Howell Macintosh, COTA/L Acute Rehabilitation [phone]   Mechele Spiegel 02/07/2024, 11:10 AM

## 2024-02-07 NOTE — Progress Notes (Signed)
 PROGRESS NOTE    Earl Campbell  WGN:[id number] DOB: 04-23-1961 DOA: 02/05/2024 PCP: Candiss Chamorro, MD   Brief Narrative:  Hospitalist consulted for general medical management and routine care under the service of neurosurgery.  Patient has known history of alcohol use disorder, C2 cervical fracture, hypertension, CAD status post PCI, hyperlipidemia, and urethral dilation who presented to the hospital after recent outpatient MRI showed severe stenosis at C1 and 2 with cord signal changes requiring urgent intervention with neurosurgery.  Tolerated surgical procedure quite well.  Assessment & Plan:   Principal Problem:   C2 cervical fracture (HCC)  Alcohol use/abuse, chronic  Questionable alcohol withdrawals -Patient reports various amounts of alcohol intake ranging from 6-12 beers daily for what appears to be years if not decades. - Initiated on CIWA protocol given previous agitation postoperatively -will continue for now - Consider Librium taper over the next 24 hours if patient's symptoms are not well-controlled or patient is planned for discharge.  Acute urinary retention Recent hx of urethral dilation (01/09/2024) -Foley catheter placed, will leave intact with outpatient follow-up with urology given patient's complicated history.  C2 cervical fracture s/p C1-2 posterior cervical fusion/foraminotomy (POD 1) Ambulatory dysfunction, acute on chronic -Neurosurgery following as primary team -recommending collar at all times -PT/OT -to follow   HTN -Poorly controlled in the setting of pain -but now within normal limits with pain control - Continue diltiazem  (consider transitioning to alternative regimen given comorbid conditions if patient continues to be uncontrolled)  CAD s/p PCI HLD -holding patient's home aspirin -not on any statin therapy at home - Would benefit from ACE/ARB/beta-blocker if able to tolerate   DVT prophylaxis: SCD's Start: 02/05/24 1955 Code Status:    Code Status: Full Code Family Communication: None present  Status is: Inpatient  Dispo: The patient is from: Home              Anticipated d/c is to: Per primary              Anticipated d/c date is: Per primary  Consultants:  We are, neurosurgery primary  Procedures:  C-spine surgery 02/05/2024 as above  Antimicrobials:  Perioperatively  Subjective: No acute issues or events overnight, pain ongoing but markedly more controlled than previous.  Otherwise denies nausea vomiting diarrhea constipation high fevers chills or chest pain  Objective: Vitals:   02/06/24 2245 02/07/24 0010 02/07/24 0345 02/07/24 0734  BP:  (!) 140/95 (!) 168/67 (!) 141/68  Pulse: 78 91 63 77  Resp: 14 16 16 19   Temp:  98.4 F (36.9 C) 98.4 F (36.9 C) 98.6 F (37 C)  TempSrc:  Oral Oral Oral  SpO2: 99% 99% 99% 98%  Weight:      Height:        Intake/Output Summary (Last 24 hours) at 02/07/2024 0748 Last data filed at 02/07/2024 0400 Gross per 24 hour  Intake 720 ml  Output 2675 ml  Net -1955 ml   Filed Weights   02/05/24 0842  Weight: 60.8 kg    Examination:  General:  Pleasantly resting in bed, No acute distress. HEENT:  Normocephalic atraumatic.  Sclerae nonicteric, noninjected.  Extraocular movements intact bilaterally. Neck: C-collar intact. Lungs:  Clear to auscultate bilaterally without rhonchi, wheeze, or rales. Heart:  Regular rate and rhythm.  Without murmurs, rubs, or gallops. Abdomen:  Soft, nontender, nondistended.  Without guarding or rebound. Extremities: Without cyanosis, clubbing, edema, or obvious deformity.  Data Reviewed: I have personally reviewed following labs and imaging studies  CBC: Recent Labs  Lab 02/03/24 0930  WBC 5.1  HGB 12.1*  HCT 34.6*  MCV 90.1  PLT 117*   Basic Metabolic Panel: Recent Labs  Lab 02/03/24 0930 02/06/24 0658  NA 132* 135  K 4.0 3.5  CL 105 105  CO2 18* 20*  GLUCOSE 123* 134*  BUN 27* 24*  CREATININE 1.88* 1.80*   CALCIUM  9.1 8.9   GFR: Estimated Creatinine Clearance: 35.2 mL/min (A) (by C-G formula based on SCr of 1.8 mg/dL (H)).  Liver Function Tests: Recent Labs  Lab 02/03/24 0930  AST 63*  ALT 40  ALKPHOS 119  BILITOT 0.9  PROT 7.4  ALBUMIN 3.5   Radiology Studies: DG Cervical Spine 1 View Result Date: 02/05/2024 CLINICAL DATA:  Elective surgery. EXAM: DG CERVICAL SPINE - 1 VIEW COMPARISON:  None Available. FINDINGS: Two fluoroscopic spot views of the cervical spine submitted from the operating room. Posterior fusion hardware C1-C2. Fluoroscopy time 1 minutes 21 seconds. Dose 6.74 mGy. IMPRESSION: Intraoperative fluoroscopy during C1-C2 fusion. Electronically Signed   By: Chadwick Colonel M.D.   On: 02/05/2024 16:04   DG C-Arm 1-60 Min-No Report Result Date: 02/05/2024 Fluoroscopy was utilized by the requesting physician.  No radiographic interpretation.   DG C-Arm 1-60 Min-No Report Result Date: 02/05/2024 Fluoroscopy was utilized by the requesting physician.  No radiographic interpretation.   DG C-Arm 1-60 Min-No Report Result Date: 02/05/2024 Fluoroscopy was utilized by the requesting physician.  No radiographic interpretation.   DG C-Arm 1-60 Min-No Report Result Date: 02/05/2024 Fluoroscopy was utilized by the requesting physician.  No radiographic interpretation.     Scheduled Meds:  cholecalciferol   1,000 Units Oral q AM   diltiazem   180 mg Oral q AM   docusate sodium   100 mg Oral BID   ferrous gluconate   324 mg Oral q AM   folic acid   1 mg Oral Daily   multivitamin with minerals  1 tablet Oral Daily   sodium chloride  flush  3 mL Intravenous Q12H   thiamine   100 mg Oral Daily   Or   thiamine   100 mg Intravenous Daily   Continuous Infusions:   LOS: 2 days   Time spent:  Haydee Lipa, DO [facility]  If 7PM-7AM, please contact night-coverage [email or web address]  02/07/2024, 7:48 AM

## 2024-02-07 NOTE — Progress Notes (Signed)
 Physical Therapy Treatment Patient Details Name: Earl Campbell MRN: [id number] DOB: May 02, 1961 Today's Date: 02/07/2024   History of Present Illness 63 yo with progressive myelopathy and gait instability due to type 2 odontoid fx. 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 Underwent urgent open reduction of C2 fx, posteiror arthrodesis C1-2, C1-2 laminectomy, C2-3 posterior decompression. PMH:CAD, hep C CKD, urethral stricture dilation on 01/09/24.    PT Comments  The pt has had a functional decline, now needing minA to roll and modA to transition sidelying <> sit EOB, transfer to stand, and take a few side steps along the EOB with HHA. He is demonstrating a strong posterior and L lateral lean today in sitting and standing as well with poor awareness of it. No other asymmetric weakness noted and pt denied numbness/tingling in his legs though. Notified RN and MD of these concerning changes and functional decline. Will continue to follow acutely.   If plan is discharge home, recommend the following: Assistance with cooking/housework;Assist for transportation;Help with stairs or ramp for entrance;A lot of help with walking and/or transfers;A lot of help with bathing/dressing/bathroom;Direct supervision/assist for medications management;Direct supervision/assist for financial management   Can travel by private vehicle        Equipment Recommendations  Rolling walker (2 wheels);BSC/3in1    Recommendations for Other Services Rehab consult     Precautions / Restrictions Precautions Precautions: Fall;Cervical Precaution Booklet Issued: Yes (comment) Recall of Precautions/Restrictions: Impaired Required Braces or Orthoses: Cervical Brace Cervical Brace: Hard collar;At all times Restrictions Weight Bearing Restrictions Per Provider Order: No     Mobility  Bed Mobility Overal bed mobility: Needs Assistance Bed Mobility: Rolling, Sidelying to  Sit, Sit to Sidelying Rolling: Min assist Sidelying to sit: Mod assist, HOB elevated     Sit to sidelying: Mod assist, HOB elevated General bed mobility comments: Pt needed repeated multi-modal cues and assistance to comply to precautions with bed mobility. MinA to roll to L and modA to ascend trunk to sit up EOB. ModA needed to direct trunk and lift legs back to sidelying    Transfers Overall transfer level: Needs assistance Equipment used: 1 person hand held assist Transfers: Sit to/from Stand Sit to Stand: Mod assist           General transfer comment: Pt holding onto therapist anterior to him for balance and to pull up to stand from EOB, modA to power up to stand, shift weight anteriorly, and maintain balance    Ambulation/Gait Ambulation/Gait assistance: Mod assist, Min assist Gait Distance (Feet): 2 Feet Assistive device: 1 person hand held assist Gait Pattern/deviations: Step-through pattern, Decreased stride length, Decreased dorsiflexion - right, Decreased dorsiflexion - left, Leaning posteriorly Gait velocity: decreased Gait velocity interpretation: <1.31 ft/sec, indicative of household ambulator   General Gait Details: Pt declined attempts to ambulate further than side stepping along EOB, reporting desire just to sit back down and return to bed. Min-modA needed for balance with HHA provided. Posterior and L lateral lean noted.   Stairs             Wheelchair Mobility     Tilt Bed    Modified Rankin (Stroke Patients Only)       Balance Overall balance assessment: Needs assistance Sitting-balance support: Feet supported, Bilateral upper extremity supported, No upper extremity supported Sitting balance-Leahy Scale: Poor Sitting balance - Comments: Posterior and L lateral lean, mod-maxA needed for static sitting balance today Postural control: Posterior lean, Left lateral  lean Standing balance support: During functional activity, Single extremity  supported Standing balance-Leahy Scale: Poor Standing balance comment: HHA and min-modA for static standing balance, L lean and posterior lean noted. Visual and tactile and verbal cues provided to correct L lean with min momentary success noted                            Communication Communication Communication: No apparent difficulties  Cognition Arousal: Lethargic, Alert Behavior During Therapy: Impulsive, Restless   PT - Cognitive impairments: No family/caregiver present to determine baseline, Awareness, Problem solving, Safety/Judgement, Orientation, Attention, Initiation, Sequencing   Orientation impairments: Time, Situation                   PT - Cognition Comments: CIWA precautions. Lethargic when supine but able to easily arouse. Pt restless and impulsive when sitting EOB and demonstrates poor awareness of his directional lean placing him at risk for falls, needing repeated multi-modal cues to identify his lean and correct it. Slow processing. Pt aware he is in [place] but unable to state that he had surgery. Pt unable to state the current year Following commands: Impaired Following commands impaired: Follows one step commands inconsistently, Follows one step commands with increased time    Cueing Cueing Techniques: Verbal cues, Tactile cues, Visual cues  Exercises      General Comments General comments (skin integrity, edema, etc.): NT took temp = 99.7 degrees F; pt grossly symmetrically weak in legs with MMT scores of 4/5 in bil quads; pt denying numbness/tingling; did not notice any one sided weakness, just L lateral lean      Pertinent Vitals/Pain Pain Assessment Pain Assessment: Faces Faces Pain Scale: Hurts little more Pain Location: generalized, assumed neck Pain Descriptors / Indicators: Grimacing, Operative site guarding, Discomfort, Moaning Pain Intervention(s): Limited activity within patient's tolerance, Monitored during session,  Repositioned    Home Living                          Prior Function            PT Goals (current goals can now be found in the care plan section) Acute Rehab PT Goals Patient Stated Goal: go home PT Goal Formulation: With patient Time For Goal Achievement: 02/20/24 Potential to Achieve Goals: Good Progress towards PT goals: Not progressing toward goals - comment (functional decline)    Frequency    Min 5X/week      PT Plan      Co-evaluation              AM-PAC PT "6 Clicks" Mobility   Outcome Measure  Help needed turning from your back to your side while in a flat bed without using bedrails?: A Little Help needed moving from lying on your back to sitting on the side of a flat bed without using bedrails?: A Lot Help needed moving to and from a bed to a chair (including a wheelchair)?: A Lot Help needed standing up from a chair using your arms (e.g., wheelchair or bedside chair)?: A Lot Help needed to walk in hospital room?: Total Help needed climbing 3-5 steps with a railing? : Total 6 Click Score: 11    End of Session Equipment Utilized During Treatment: Gait belt;Cervical collar Activity Tolerance: Patient limited by lethargy Patient left: with call bell/phone within reach;in bed;with bed alarm set;with nursing/sitter in room Nurse Communication: Mobility status;Other (comment) (pt's  functional and cognitive decline and concerning presentation with L lean) PT Visit Diagnosis: Unsteadiness on feet (R26.81);Other abnormalities of gait and mobility (R26.89);Difficulty in walking, not elsewhere classified (R26.2);Pain;Muscle weakness (generalized) (M62.81) Pain - part of body:  (cervical)     Time: 6045-4098 PT Time Calculation (min) (ACUTE ONLY): 16 min  Charges:    $Therapeutic Activity: 8-22 mins PT General Charges $$ ACUTE PT VISIT: 1 Visit                     Vernida Goodie, PT, DPT Acute Rehabilitation Services  Office:  [phone]    Ellyn Hack 02/07/2024, 4:12 PM

## 2024-02-07 NOTE — Progress Notes (Signed)
 Overall doing reasonably well.  Patient's pain well-controlled.  No new neurologic symptoms.  Wound clean and dry.  Progressing well following posterior cervical fusion for treatment of chronic unstable odontoid fracture.  Continue efforts at therapy and mobilization.  Will likely require skilled nursing facility versus inpatient rehabilitation for further improvement.  Continue brace.

## 2024-02-08 DIAGNOSIS — F10930 Alcohol use, unspecified with withdrawal, uncomplicated: Secondary | ICD-10-CM | POA: Diagnosis not present

## 2024-02-08 DIAGNOSIS — S12191A Other nondisplaced fracture of second cervical vertebra, initial encounter for closed fracture: Secondary | ICD-10-CM | POA: Diagnosis not present

## 2024-02-08 MED ORDER — POLYETHYLENE GLYCOL 3350 17 G PO PACK
17.0000 g | PACK | Freq: Two times a day (BID) | ORAL | Status: DC | PRN
  Administered 2024-02-09: 17 g via ORAL
  Filled 2024-02-08: qty 1

## 2024-02-08 NOTE — Progress Notes (Signed)
 Inpatient Rehab Admissions:  Inpatient Rehab Consult received.  I met with patient at the bedside for rehabilitation assessment and to discuss goals and expectations of an inpatient rehab admission.  Discussed average length of stay and discharge home after completion of CIR. Pt is not interested in CIR. TOC made aware.  Signed: Artemus Larsen, MS, CCC-SLP Admissions Coordinator [phone]

## 2024-02-08 NOTE — Progress Notes (Signed)
 Overall stable.  Patient complains of posterior cervical pain.  No new numbness paresthesias or weakness.  Wound healing well.  Motor and sensory function stable.  Patient afebrile.  Progressing reasonably well following C1 to posterior cervical fusion.  Continue efforts at mobilization and pain control.

## 2024-02-08 NOTE — Progress Notes (Signed)
 PROGRESS NOTE    Earl Campbell  ZOX:[id number] DOB: January 03, 1961 DOA: 02/05/2024 PCP: Candiss Chamorro, MD   Brief Narrative:  Hospitalist consulted for general medical management and routine care under the service of neurosurgery.  Patient has known history of alcohol use disorder, C2 cervical fracture, hypertension, CAD status post PCI, hyperlipidemia, and urethral dilation who presented to the hospital after recent outpatient MRI showed severe stenosis at C1 and 2 with cord signal changes requiring urgent intervention with neurosurgery.  Tolerated surgical procedure quite well.  Assessment & Plan:   Principal Problem:   C2 cervical fracture (HCC)  Alcohol use/abuse, chronic  Questionable alcohol withdrawals -Patient reports various amounts of alcohol intake ranging from 6-12 beers daily for what appears to be years if not decades. - Initiated on CIWA protocol given previous agitation postoperatively -receiving minimal doses of Ativan , has not triggered administration now in over 24 hours - Consider Librium taper over the next 24 hours if patient's symptoms are not well-controlled or patient is planned for discharge.  Acute urinary retention Recent hx of urethral dilation (01/09/2024) -Foley catheter placed, will leave intact with outpatient follow-up with urology given patient's complicated history.  C2 cervical fracture s/p C1-2 posterior cervical fusion/foraminotomy (POD 1) Ambulatory dysfunction, acute on chronic -Neurosurgery following as primary team -recommending collar at all times -PT/OT -to follow   HTN -Poorly controlled in the setting of pain -but now within normal limits with pain control - Continue diltiazem  (consider transitioning to alternative regimen given comorbid conditions if patient continues to be uncontrolled)  CAD s/p PCI HLD -holding patient's home aspirin -not on any statin therapy at home - Would benefit from ACE/ARB/beta-blocker if able to  tolerate   DVT prophylaxis: SCD's Start: 02/05/24 1955 Code Status:   Code Status: Full Code Family Communication: None present  Status is: Inpatient  Dispo: The patient is from: Home              Anticipated d/c is to: Initial plan to discharge to CIR, patient now refusing discharge " anywhere but home"              Anticipated d/c date is: Per primary  Consultants:  We are, neurosurgery primary  Procedures:  C-spine surgery 02/05/2024 as above  Antimicrobials:  Perioperatively  Subjective: No acute issues or events overnight, pain ongoing but markedly more controlled than previous.  Otherwise denies nausea vomiting diarrhea constipation high fevers chills or chest pain  Objective: Vitals:   02/07/24 1913 02/07/24 2338 02/08/24 0355 02/08/24 0750  BP: (!) 176/85 (!) 170/68 (!) 179/77 (!) 152/69  Pulse: 90 69 69 96  Resp: 16 17 17  (!) 21  Temp: 98.7 F (37.1 C) 98.5 F (36.9 C) 97.9 F (36.6 C) 98.4 F (36.9 C)  TempSrc: Oral Oral  Oral  SpO2: 96% 97% 99% 99%  Weight:      Height:        Intake/Output Summary (Last 24 hours) at 02/08/2024 0805 Last data filed at 02/08/2024 0420 Gross per 24 hour  Intake --  Output 1000 ml  Net -1000 ml   Filed Weights   02/05/24 0842  Weight: 60.8 kg    Examination:  General:  Pleasantly resting in bed, No acute distress. HEENT:  Normocephalic atraumatic.  Sclerae nonicteric, noninjected.  Extraocular movements intact bilaterally. Neck: C-collar intact. Lungs:  Clear to auscultate bilaterally without rhonchi, wheeze, or rales. Heart:  Regular rate and rhythm.  Without murmurs, rubs, or gallops. Abdomen:  Soft, nontender,  nondistended.  Without guarding or rebound. Extremities: Without cyanosis, clubbing, edema, or obvious deformity.  Data Reviewed: I have personally reviewed following labs and imaging studies  CBC: Recent Labs  Lab 02/03/24 0930  WBC 5.1  HGB 12.1*  HCT 34.6*  MCV 90.1  PLT 117*   Basic Metabolic  Panel: Recent Labs  Lab 02/03/24 0930 02/06/24 0658  NA 132* 135  K 4.0 3.5  CL 105 105  CO2 18* 20*  GLUCOSE 123* 134*  BUN 27* 24*  CREATININE 1.88* 1.80*  CALCIUM  9.1 8.9   GFR: Estimated Creatinine Clearance: 35.2 mL/min (A) (by C-G formula based on SCr of 1.8 mg/dL (H)).  Liver Function Tests: Recent Labs  Lab 02/03/24 0930  AST 63*  ALT 40  ALKPHOS 119  BILITOT 0.9  PROT 7.4  ALBUMIN 3.5   Radiology Studies: No results found.    Scheduled Meds:  Chlorhexidine  Gluconate Cloth  6 each Topical Daily   cholecalciferol   1,000 Units Oral q AM   diltiazem   180 mg Oral q AM   docusate sodium   100 mg Oral BID   ferrous gluconate   324 mg Oral q AM   folic acid   1 mg Oral Daily   multivitamin with minerals  1 tablet Oral Daily   sodium chloride  flush  3 mL Intravenous Q12H   thiamine   100 mg Oral Daily   Or   thiamine   100 mg Intravenous Daily   Continuous Infusions:   LOS: 3 days   Time spent:  Haydee Lipa, DO [facility]  If 7PM-7AM, please contact night-coverage [email or web address]  02/08/2024, 8:05 AM

## 2024-02-08 NOTE — Plan of Care (Signed)
   Problem: Activity: Goal: Risk for activity intolerance will decrease Outcome: Progressing   Problem: Nutrition: Goal: Adequate nutrition will be maintained Outcome: Progressing   Problem: Coping: Goal: Level of anxiety will decrease Outcome: Progressing   Problem: Elimination: Goal: Will not experience complications related to bowel motility Outcome: Progressing   Problem: Safety: Goal: Ability to remain free from injury will improve Outcome: Progressing

## 2024-02-08 NOTE — TOC Transition Note (Signed)
 Transition of Care [facility]) - Discharge Note   Patient Details  Name: Earl Campbell MRN: [id number] Date of Birth: [birth year]  Transition of [facility] CM/SW Contact:  Jannine Meo, RN Phone Number: 02/08/2024, 1:09 PM   Clinical Narrative:   Patient declined CIR and wants to go home by tomorrow. Provider made aware and [facility] PT/OT arranged through [facility] with [facility].    Final next level of care: Home w Home Health Services     Patient Goals and CMS Choice            Discharge Placement                       Discharge Plan and Services Additional resources added to the After Visit Summary for                            [facility] Arranged: PT, OT HH Agency: [facility] Date [facility] Agency Contacted: 02/08/24 Time HH Agency Contacted: 1235 Representative spoke with at [facility] Agency: Bartholomew Light  Social Drivers of Health (SDOH) Interventions SDOH Screenings   Food Insecurity: Patient Declined (02/06/2024)  Housing: Unknown (02/06/2024)  Transportation Needs: No Transportation Needs (02/06/2024)  Utilities: Not At Risk (02/06/2024)  Social Connections: Unknown (02/06/2024)  Tobacco Use: High Risk (02/03/2024)     Readmission Risk Interventions     No data to display

## 2024-02-08 NOTE — Progress Notes (Addendum)
 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 Underwent urgent open reduction of C2 fx, posteiror arthrodesis C1-2, C1-2 laminectomy, C2-3 posterior decompression. PMH:CAD, hep C CKD, urethral stricture dilation on 01/09/24.   OT comments  Pt. Seen for skilled OT treatment session.  Found in bed trying to reach over head with BUEs to pull on headboard stating " I slid down and need to scoot up".  Review of cervical precautions and educated on not raising UEs over head and trying to pull.  Pt. Verbalized understanding.  Pt. More alert today but stating "I dont feel like doing this right now".  Encouragement for participation.  Pt. Reports he is L hand dominant.  Able to hold adaptive utensils and bring to mouth without issue.  Reviewed button hook and reacher.  Will need cont. Return demo next session.  Yellow theraputty and handouts provided. Pt. Able to make log and perform pinch with L/R digits.  Cont. To review handouts and return demo from pt. Next session as pt. able.  Bed mobility to/from eob with min. A. Cues for maintaining cervical precautions.  Pt. Grimacing with c/o stomach pain during mobility.  RN made aware. Pt. Wanting to lay back down at end of session.  Cont. With HEP and A/E next session with reinforcement of cervical precautions during mobility and ADLs.        If plan is discharge home, recommend the following:  A little help with bathing/dressing/bathroom;Assistance with cooking/housework;Assist for transportation   Equipment Recommendations  None recommended by OT    Recommendations for Other Services Rehab consult    Precautions / Restrictions Precautions Precautions: Fall;Cervical Recall of  Precautions/Restrictions: Impaired Required Braces or Orthoses: Cervical Brace Cervical Brace: Hard collar;At all times       Mobility Bed Mobility                    Transfers                         Balance                                           ADL either performed or assessed with clinical judgement   ADL Overall ADL's : Needs assistance/impaired                 Upper Body Dressing : With adaptive equipment;Sitting Upper Body Dressing Details (indicate cue type and reason): description and demo of button hook, pt. stating "I dont feel like doing this right now".  will benefit from further education and review Lower Body Dressing: With adaptive equipment Lower Body Dressing Details (indicate cue type and reason): reviewed use of reacher, pt. not wanting to return demo at this time, will benefit from further education and review               General ADL Comments: provided large handle spoon/knife/fork, pt. is L  hand dominant and was able to hold each and bring to mouth without issue.  reviewed taking them off of tray when finished so they dont get lost.  he kept saying put those  in my bag and ill take them home and keep using the red foam.  reviewed they can be used here also.    Extremity/Trunk Assessment              Vision       Restaurant manager, fast food Communication: No apparent difficulties   Cognition Arousal: Awake/alert but stated when he answered a phone call that he was a little "out of it right now" Behavior During Therapy: [facility] for tasks assessed/performed Cognition: No family/caregiver present to determine baseline                               Following commands: Impaired Following commands impaired: Follows one step commands inconsistently, Follows one step commands with increased time      Cueing   Cueing Techniques: Verbal cues, Tactile cues, Visual  cues  Exercises Other Exercises Other Exercises: yellow T putty for grip and pinch strengthening with handouts.  pt. able to make log and perform pinch with each hand then declined further return demo with rest of the handouts    Shoulder Instructions       General Comments      Pertinent Vitals/ Pain       Pain Assessment Pain Assessment: Faces Faces Pain Scale: Hurts little more Pain Location: stomach with mobility, and head ache Pain Descriptors / Indicators: Grimacing Pain Intervention(s): Limited activity within patient's tolerance, Monitored during session, Repositioned, Patient requesting pain meds-RN notified  Home Living                                          Prior Functioning/Environment              Frequency  Min 2X/week        Progress Toward Goals  OT Goals(current goals can now be found in the care plan section)  Progress towards OT goals: Progressing toward goals     Plan      Co-evaluation                 AM-PAC OT "6 Clicks" Daily Activity     Outcome Measure   Help from another person eating meals?: A Little Help from another person taking care of personal grooming?: A Little Help from another person toileting, which includes using toliet, bedpan, or urinal?: A Little Help from another person bathing (including washing, rinsing, drying)?: A Little Help from another person to put on and taking off regular upper body clothing?: A Lot Help from another person to put on and taking off regular lower body clothing?: A Little 6 Click Score: 17    End of Session Equipment Utilized During Treatment: Other (comment) (adaptive utensils and theraputty)  OT Visit Diagnosis: Unsteadiness on feet (R26.81);Other abnormalities of gait and mobility (R26.89);Muscle weakness (generalized) (M62.81);Pain   Activity Tolerance Other (comment) (more alert but stating not wanting to participate and wanted to lay down)   Patient Left  in bed;with call bell/phone within reach;with bed alarm set;with SCD's reapplied   Nurse Communication Other (comment) (secure chat with session details and pt. c/o headache and stomach pains with mobility)        Time: 1047-1101 OT Time Calculation (min): 14 min  Charges: OT General Charges $OT Visit: 1 Visit OT Treatments $Self  Care/Home Management : 8-22 mins  Howell Macintosh, COTA/L Acute Rehabilitation [phone]   Leory Rands [place]-COTA/L  02/08/2024, 11:51 AM

## 2024-02-09 ENCOUNTER — Encounter (HOSPITAL_COMMUNITY): Payer: Self-pay | Admitting: Neurosurgery

## 2024-02-09 MED ORDER — OXYCODONE HCL 10 MG PO TABS: 10.0000 mg | ORAL_TABLET | ORAL | 0 refills | Status: AC | PRN

## 2024-02-09 MED ORDER — CARVEDILOL 6.25 MG PO TABS: 6.2500 mg | ORAL_TABLET | Freq: Two times a day (BID) | ORAL | 1 refills | Status: AC

## 2024-02-09 MED ORDER — CARVEDILOL 6.25 MG PO TABS
6.2500 mg | ORAL_TABLET | Freq: Two times a day (BID) | ORAL | Status: DC
  Administered 2024-02-09 (×2): 6.25 mg via ORAL
  Filled 2024-02-09 (×2): qty 1

## 2024-02-09 MED ORDER — METHOCARBAMOL 500 MG PO TABS: 500.0000 mg | ORAL_TABLET | Freq: Four times a day (QID) | ORAL | 2 refills | Status: AC | PRN

## 2024-02-09 MED ORDER — DOCUSATE SODIUM 100 MG PO CAPS: 100.0000 mg | ORAL_CAPSULE | Freq: Two times a day (BID) | ORAL | 0 refills | Status: AC

## 2024-02-09 MED ORDER — CARVEDILOL 6.25 MG PO TABS: 6.2500 mg | ORAL_TABLET | Freq: Two times a day (BID) | ORAL | Status: DC

## 2024-02-09 NOTE — Progress Notes (Addendum)
 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 Underwent urgent open reduction of C2 fx, posteiror arthrodesis C1-2, C1-2 laminectomy, C2-3 posterior decompression. PMH:CAD, hep C CKD, urethral stricture dilation on 01/09/24.    PT Comments  Pt progressing towards his physical therapy goals, with improved balance, coordination and ambulation distance this session. Pt initially requiring minA, but transitioning to CGA, with ambulating two half laps around unit with RW. Set up assist provided for lunch with built up foam utensils. Pt reports continued hand numbness. Would still benefit from intensive acute rehabilitation in light of deficits and decreased caregiver support, in order to become modI,however, pt preference for home.     If plan is discharge home, recommend the following: Assistance with cooking/housework;Assist for transportation;Help with stairs or ramp for entrance;A lot of help with walking and/or transfers;A lot of help with bathing/dressing/bathroom;Direct supervision/assist for medications management;Direct supervision/assist for financial management   Can travel by private vehicle        Equipment Recommendations  Rolling walker (2 wheels);BSC/3in1    Recommendations for Other Services       Precautions / Restrictions Precautions Precautions: Fall;Cervical Precaution Booklet Issued: Yes (comment) Recall of Precautions/Restrictions: Impaired Required Braces or Orthoses: Cervical Brace Cervical Brace: Hard collar;At all times Restrictions Weight Bearing Restrictions Per Provider Order: No     Mobility  Bed Mobility Overal bed mobility: Modified Independent             General bed  mobility comments: HOB elevated    Transfers Overall transfer level: Needs assistance Equipment used: Rolling walker (2 wheels) Transfers: Sit to/from Stand Sit to Stand: Contact guard assist                Ambulation/Gait Ambulation/Gait assistance: Contact guard assist, Min assist Gait Distance (Feet): 240 Feet Assistive device: Rolling walker (2 wheels) Gait Pattern/deviations: Step-through pattern, Decreased stride length, Wide base of support, Ataxic, Decreased dorsiflexion - right, Decreased dorsiflexion - left Gait velocity: decreased Gait velocity interpretation: <1.31 ft/sec, indicative of household ambulator   General Gait Details: Mildly ataxic, wide BOS, decreased bilateral heel strike at initial contact   Stairs             Wheelchair Mobility     Tilt Bed    Modified Rankin (Stroke Patients Only)       Balance Overall balance assessment: Needs assistance Sitting-balance support: Feet supported, No upper extremity supported Sitting balance-Leahy Scale: Good     Standing balance support: Bilateral upper extremity supported, During functional activity Standing balance-Leahy Scale: Poor                              Communication Communication Communication: Impaired Factors Affecting Communication: Hearing impaired  Cognition Arousal: Alert Behavior During Therapy: WFL for tasks assessed/performed   PT - Cognitive impairments: No family/caregiver present to determine baseline, Awareness, Problem solving, Safety/Judgement, Attention, Initiation, Sequencing                       PT - Cognition Comments: Decreased awareness of deficits and precautions, pt frequently stating, "I've been dealing with this for 2 years." Following commands: Impaired Following commands impaired: Follows one step commands inconsistently, Follows one  step commands with increased time    Cueing Cueing Techniques: Verbal cues, Tactile cues,  Visual cues  Exercises      General Comments        Pertinent Vitals/Pain Pain Assessment Pain Assessment: Faces Faces Pain Scale: Hurts a little bit Pain Location: surgical site Pain Descriptors / Indicators: Operative site guarding Pain Intervention(s): Monitored during session    Home Living                          Prior Function            PT Goals (current goals can now be found in the care plan section) Acute Rehab PT Goals Patient Stated Goal: go home Potential to Achieve Goals: Good Progress towards PT goals: Progressing toward goals    Frequency    Min 5X/week      PT Plan      Co-evaluation              AM-PAC PT "6 Clicks" Mobility   Outcome Measure  Help needed turning from your back to your side while in a flat bed without using bedrails?: None Help needed moving from lying on your back to sitting on the side of a flat bed without using bedrails?: None Help needed moving to and from a bed to a chair (including a wheelchair)?: A Little Help needed standing up from a chair using your arms (e.g., wheelchair or bedside chair)?: A Little Help needed to walk in hospital room?: A Little Help needed climbing 3-5 steps with a railing? : A Lot 6 Click Score: 19    End of Session Equipment Utilized During Treatment: Gait belt;Cervical collar Activity Tolerance: Patient tolerated treatment well Patient left: in chair;with call bell/phone within reach;with chair alarm set Nurse Communication: Mobility status PT Visit Diagnosis: Unsteadiness on feet (R26.81);Other abnormalities of gait and mobility (R26.89);Difficulty in walking, not elsewhere classified (R26.2);Pain;Muscle weakness (generalized) (M62.81) Pain - part of body:  (cervical)     Time: 8657-8469 PT Time Calculation (min) (ACUTE ONLY): 25 min  Charges:    $Therapeutic Activity: 23-37 mins PT General Charges $$ ACUTE PT VISIT: 1 Visit                     Verdia Glad,  PT, DPT Acute Rehabilitation Services Office [phone]    Claria Crofts 02/09/2024, 12:49 PM

## 2024-02-09 NOTE — Discharge Summary (Signed)
  Patient ID: Earl Campbell MRN: [id number] DOB/AGE: 06/21/1961 63 y.o.  Admit date: 02/05/2024 Discharge date: 02/09/2024  Admission Diagnoses: Closed odontoid fracture with type II morphology, posterior displacement, and routine healing [S12.111D] C2 cervical fracture [facility]) [S12.100A]   Discharge Diagnoses: Same   Discharged Condition: Stable  Hospital Course:  Earl Campbell is a 63 y.o. male who was admitted following an uncomplicated C1-2 PCDF. They were recovered in PACU and transferred to the floor. Patient with HTN and urinary retention post op. New outpt meds sent for BP, and pt to f/u w/ Urology outpt for foley removal. Patient received recommendation to dc to CIR, he refused and wished to dc to home. Risks d/w patient. Pt to f/u in office for routine post op visit. Pt is in agreement w/ plan.    Discharge Exam: Blood pressure (!) 169/85, pulse 83, temperature 97.9 F (36.6 C), temperature source Axillary, resp. rate 20, height 5\' 10"  (1.778 m), weight 60.8 kg, SpO2 100%. A&O Speech fluent, appropriate Strength/sensation grossly intact BUE/BLE.  Dressing c/d/I.  Collar in place  Disposition: Discharge disposition: 01-Home or Self Care       Discharge Instructions     Incentive spirometry RT   Complete by: As directed       Allergies as of 02/09/2024   No Known Allergies      Medication List     PAUSE taking these medications    aspirin EC 325 MG tablet Wait to take this until: Feb 12, 2024 Take 325 mg by mouth daily.       STOP taking these medications    HYDROcodone -acetaminophen  10-325 MG tablet Commonly known as: NORCO   ibuprofen 200 MG tablet Commonly known as: ADVIL       TAKE these medications    carvedilol 6.25 MG tablet Commonly known as: COREG Take 1 tablet (6.25 mg total) by mouth 2 (two) times daily with a meal.   D-1000 Extra Strength 25 MCG (1000 UT) tablet Generic drug: Cholecalciferol  Take 1,000 Units by mouth in  the morning.   diltiazem  180 MG 24 hr capsule Commonly known as: CARDIZEM  CD Take 180 mg by mouth in the morning.   docusate sodium  100 MG capsule Commonly known as: COLACE Take 1 capsule (100 mg total) by mouth 2 (two) times daily.   ferrous gluconate  324 MG tablet Commonly known as: FERGON Take 324 mg by mouth in the morning.   ferrous sulfate  325 (65 FE) MG EC tablet Take 325 mg by mouth in the morning.   methocarbamol  500 MG tablet Commonly known as: ROBAXIN  Take 1 tablet (500 mg total) by mouth every 6 (six) hours as needed for muscle spasms.   Oxycodone  HCl 10 MG Tabs Take 1 tablet (10 mg total) by mouth every 4 (four) hours as needed for severe pain (pain score 7-10).         Signed: Lc Joynt CAYLIN Judene Logue 02/09/2024, 11:44 AM

## 2024-02-09 NOTE — TOC Transition Note (Addendum)
 Transition of Care (TOC) - Discharge Note Sherin Dingwall RN,BSN Transitions of Care Unit 4NP (Non Trauma)- RN Case Manager See Treatment Team for direct Phone #   Patient Details  Name: Earl Campbell MRN: [id number] Date of Birth: 30-Mar-1961  Transition of Care [facility]) CM/SW Contact:  Rox Cope, RN Phone Number: 02/09/2024, 2:05 PM   Clinical Narrative:    Pt stable for transition home today, Noted CM from weekend made referral for [facility] needs to [facility]- requested orders for PT/OT from attending team.   Cm spoke with pt at bedside to review transition needs, PT also present at the bedside. Pt voiced that he would like to go to rehab location in [place], he voiced that he has been to that location in past and would drive himself there. On further questioning determined that pt was speaking about [facility] outpt therapy at [facility], explained to pt that at this time he is not suppose to be driving and current recommendations are for either INPT rehab or HH. Pt states that he does not want to stay here any longer for rehab and is agreeable to having someone come to his home.  Discussed DME needs- pt using RW here and agreeable to RW for home, declines BSC.  Pt voiced he has someone to call to transport home  Orders for [facility] and DME placed.   CM contacted [facility] liaison to update for start of care.   CM contacted [employer] for DME need- RW to be delivered to room prior to discharge.  1410- notified by [employer] that pt refused RW on delivery and stated to them that he would call them if he needs it after return home.     Final next level of care: Home w Home Health Services Barriers to Discharge: No Barriers Identified   Patient Goals and CMS Choice Patient states their goals for this hospitalization and ongoing recovery are:: return home   Choice offered to / list presented to : Patient      Discharge Placement               Home w/ [facility]        Discharge Plan and  Services Additional resources added to the After Visit Summary for     Discharge Planning Services: CM Consult Post Acute Care Choice: Durable Medical Equipment, Home Health          DME Arranged: Walker rolling DME Agency: [facility] Date DME Agency Contacted: 02/09/24 Time DME Agency Contacted: 1320 Representative spoke with at DME Agency: [employer] HH Arranged: PT, OT HH Agency: [facility] Date [facility] Agency Contacted: 02/08/24 Time HH Agency Contacted: 1235 Representative spoke with at [facility] Agency: Bartholomew Light  Social Drivers of Health (SDOH) Interventions SDOH Screenings   Food Insecurity: Patient Declined (02/06/2024)  Housing: Unknown (02/06/2024)  Transportation Needs: No Transportation Needs (02/06/2024)  Utilities: Not At Risk (02/06/2024)  Social Connections: Unknown (02/06/2024)  Tobacco Use: High Risk (02/03/2024)     Readmission Risk Interventions    02/09/2024    2:05 PM  Readmission Risk Prevention Plan  Medication Screening Complete  Transportation Screening Complete

## 2024-02-09 NOTE — Progress Notes (Signed)
 Pt discharge education and instructions completed with pt and friend Walterine Gunther at bedside. Both voices understanding and all questions and concerns addressed. Pt DME walker not delivered to pt at bedside but pt and friend refused DME stating pt's house is small and the walker will make pt fall. Pt discharged home with friend Reymundo Caulk to transport him home. Pt surgical incision dsg unremarkable, neck collar on and aligned. Pt foley intact and unclamped. Pt educated on foley care and advised to follow up with his outpatient Urologist. Pt to pick up electronically sent prescriptions from preferred pharmacy on file. Pt transported off unit via wheelchair with friend Reymundo Caulk and belongings to the side. P. Amo Londyn Hotard RN

## 2024-02-09 NOTE — Progress Notes (Signed)
 Pt attempted to void multiple times but unable to but pt voided 50 ml in the BR. PVR 326 ml, order for foley received. Foley inserted per order and protocol with second person NT. Perineal care performed prior to foley insertion and afterwards. Yellow, urine with some sediments and cloudiness returned in the urine bag. 350 ml emptied from bag. Pt voices relieve after foley insertion. Pt to be discharged home with foley per MD. Lorrene Rosser RN

## 2024-02-09 NOTE — Care Management Important Message (Signed)
 Important Message  Patient Details  Name: Earl Campbell MRN: [id number] Date of Birth: 01/09/1961   Important Message Given:  Yes - Medicare IM     Felix Host 02/09/2024, 3:58 PM

## 2024-02-09 NOTE — Progress Notes (Signed)
Pt foley removed per order. Pt due to void. Delia Heady RN

## 2024-07-08 ENCOUNTER — Other Ambulatory Visit: Payer: Self-pay | Admitting: Gastroenterology

## 2024-07-08 DIAGNOSIS — K7460 Unspecified cirrhosis of liver: Secondary | ICD-10-CM

## 2024-07-08 DIAGNOSIS — R160 Hepatomegaly, not elsewhere classified: Secondary | ICD-10-CM
